# Patient Record
Sex: Male | Born: 1965 | Race: White | Hispanic: No | Marital: Married | Smoking: Never smoker
Health system: Southern US, Community
[De-identification: ages and names within clinical notes are randomized; demographics above are authoritative.]

## PROBLEM LIST (undated history)

## (undated) DIAGNOSIS — N200 Calculus of kidney: Secondary | ICD-10-CM

## (undated) DIAGNOSIS — N201 Calculus of ureter: Secondary | ICD-10-CM

## (undated) DIAGNOSIS — Z87442 Personal history of urinary calculi: Secondary | ICD-10-CM

## (undated) DIAGNOSIS — R3915 Urgency of urination: Secondary | ICD-10-CM

---

## 2003-09-21 ENCOUNTER — Other Ambulatory Visit: Payer: Self-pay

## 2010-02-11 ENCOUNTER — Emergency Department: Payer: Self-pay | Admitting: Emergency Medicine

## 2012-06-03 ENCOUNTER — Emergency Department: Payer: Self-pay | Admitting: Internal Medicine

## 2012-06-03 LAB — URINALYSIS, COMPLETE
Bacteria: NONE SEEN
Bilirubin,UR: NEGATIVE
Glucose,UR: NEGATIVE mg/dL (ref 0–75)
Leukocyte Esterase: NEGATIVE
Nitrite: NEGATIVE
Ph: 5 (ref 4.5–8.0)
RBC,UR: 2 /HPF (ref 0–5)
Specific Gravity: 1.025 (ref 1.003–1.030)
WBC UR: 3 /HPF (ref 0–5)

## 2012-06-03 LAB — CBC
MCHC: 32.9 g/dL (ref 32.0–36.0)
MCV: 68 fL — ABNORMAL LOW (ref 80–100)
Platelet: 223 10*3/uL (ref 150–440)
RBC: 5.27 10*6/uL (ref 4.40–5.90)
RDW: 30.6 % — ABNORMAL HIGH (ref 11.5–14.5)
WBC: 5.9 10*3/uL (ref 3.8–10.6)

## 2012-06-03 LAB — COMPREHENSIVE METABOLIC PANEL
Albumin: 4.6 g/dL (ref 3.4–5.0)
Alkaline Phosphatase: 77 U/L (ref 50–136)
Bilirubin,Total: 0.5 mg/dL (ref 0.2–1.0)
Chloride: 105 mmol/L (ref 98–107)
EGFR (African American): >60
EGFR (Non-African Amer.): >60
Glucose: 114 mg/dL — ABNORMAL HIGH (ref 65–99)
Osmolality: 283 (ref 275–301)
Potassium: 4.1 mmol/L (ref 3.5–5.1)
Sodium: 140 mmol/L (ref 136–145)

## 2012-06-03 LAB — LIPASE, BLOOD: Lipase: 133 U/L (ref 73–393)

## 2015-06-08 ENCOUNTER — Encounter: Payer: Self-pay | Admitting: Emergency Medicine

## 2015-06-08 ENCOUNTER — Emergency Department: Payer: BLUE CROSS/BLUE SHIELD

## 2015-06-08 ENCOUNTER — Emergency Department
Admission: EM | Admit: 2015-06-08 | Discharge: 2015-06-08 | Disposition: A | Discharge status: Home / Self Care | Payer: BLUE CROSS/BLUE SHIELD | Attending: Emergency Medicine | Admitting: Emergency Medicine

## 2015-06-08 DIAGNOSIS — N201 Calculus of ureter: Secondary | ICD-10-CM | POA: Diagnosis not present

## 2015-06-08 DIAGNOSIS — R197 Diarrhea, unspecified: Secondary | ICD-10-CM | POA: Insufficient documentation

## 2015-06-08 DIAGNOSIS — R1031 Right lower quadrant pain: Secondary | ICD-10-CM | POA: Diagnosis present

## 2015-06-08 LAB — COMPREHENSIVE METABOLIC PANEL
ALBUMIN: 4.6 g/dL (ref 3.5–5.0)
ALT: 14 U/L — ABNORMAL LOW (ref 17–63)
ANION GAP: 6 (ref 5–15)
AST: 21 U/L (ref 15–41)
Alkaline Phosphatase: 63 U/L (ref 38–126)
BILIRUBIN TOTAL: 0.4 mg/dL (ref 0.3–1.2)
BUN: 21 mg/dL — ABNORMAL HIGH (ref 6–20)
CHLORIDE: 104 mmol/L (ref 101–111)
CO2: 31 mmol/L (ref 22–32)
Calcium: 9.8 mg/dL (ref 8.9–10.3)
Creatinine, Ser: 1.32 mg/dL — ABNORMAL HIGH (ref 0.61–1.24)
GFR calc Af Amer: >60 mL/min (ref >60)
GFR calc non Af Amer: >60 mL/min (ref >60)
GLUCOSE: 104 mg/dL — AB (ref 65–99)
POTASSIUM: 4.3 mmol/L (ref 3.5–5.1)
SODIUM: 141 mmol/L (ref 135–145)
TOTAL PROTEIN: 7.4 g/dL (ref 6.5–8.1)

## 2015-06-08 LAB — CBC
HEMATOCRIT: 36.4 % — AB (ref 40.0–52.0)
HEMOGLOBIN: 11.3 g/dL — AB (ref 13.0–18.0)
MCH: 21.5 pg — ABNORMAL LOW (ref 26.0–34.0)
MCHC: 31 g/dL — AB (ref 32.0–36.0)
MCV: 69.3 fL — AB (ref 80.0–100.0)
Platelets: 265 10*3/uL (ref 150–440)
RBC: 5.26 MIL/uL (ref 4.40–5.90)
RDW: 29.3 % — ABNORMAL HIGH (ref 11.5–14.5)
WBC: 8.1 10*3/uL (ref 3.8–10.6)

## 2015-06-08 LAB — LIPASE, BLOOD: Lipase: 25 U/L (ref 22–51)

## 2015-06-08 MED ORDER — OXYCODONE-ACETAMINOPHEN 5-325 MG PO TABS: 1.0000 | ORAL_TABLET | ORAL | Status: DC | PRN

## 2015-06-08 MED ORDER — BARIUM SULFATE 2.1 % PO SUSP
450.0000 mL | ORAL | Status: AC
  Administered 2015-06-08: 450 mL via ORAL
  Filled 2015-06-08: qty 450

## 2015-06-08 MED ORDER — DIPHENHYDRAMINE HCL 50 MG/ML IJ SOLN
50.0000 mg | Freq: Once | INTRAMUSCULAR | Status: AC
  Administered 2015-06-08: 50 mg via INTRAVENOUS

## 2015-06-08 MED ORDER — METHYLPREDNISOLONE SODIUM SUCC 125 MG IJ SOLR
125.0000 mg | Freq: Once | INTRAMUSCULAR | Status: AC
  Administered 2015-06-08: 125 mg via INTRAVENOUS

## 2015-06-08 MED ORDER — ONDANSETRON HCL 4 MG/2ML IJ SOLN
INTRAMUSCULAR | Status: AC
  Administered 2015-06-08: 4 mg via INTRAVENOUS
  Filled 2015-06-08: qty 2

## 2015-06-08 MED ORDER — IOHEXOL 300 MG/ML  SOLN
100.0000 mL | Freq: Once | INTRAMUSCULAR | Status: AC | PRN
  Administered 2015-06-08: 100 mL via INTRAVENOUS
  Filled 2015-06-08: qty 100

## 2015-06-08 MED ORDER — TAMSULOSIN HCL 0.4 MG PO CAPS
ORAL_CAPSULE | ORAL | Status: AC
  Filled 2015-06-08: qty 1

## 2015-06-08 MED ORDER — MORPHINE SULFATE (PF) 4 MG/ML IV SOLN
INTRAVENOUS | Status: AC
  Administered 2015-06-08: 4 mg via INTRAVENOUS
  Filled 2015-06-08: qty 1

## 2015-06-08 MED ORDER — ONDANSETRON HCL 4 MG/2ML IJ SOLN
4.0000 mg | Freq: Once | INTRAMUSCULAR | Status: AC
  Administered 2015-06-08: 4 mg via INTRAVENOUS

## 2015-06-08 MED ORDER — TAMSULOSIN HCL 0.4 MG PO CAPS
ORAL_CAPSULE | ORAL | Status: AC
  Administered 2015-06-08: 0.4 mg via ORAL
  Filled 2015-06-08: qty 1

## 2015-06-08 MED ORDER — METHYLPREDNISOLONE SODIUM SUCC 125 MG IJ SOLR
INTRAMUSCULAR | Status: AC
  Administered 2015-06-08: 125 mg via INTRAVENOUS
  Filled 2015-06-08: qty 2

## 2015-06-08 MED ORDER — MORPHINE SULFATE (PF) 4 MG/ML IV SOLN
4.0000 mg | Freq: Once | INTRAVENOUS | Status: AC
  Administered 2015-06-08: 4 mg via INTRAVENOUS

## 2015-06-08 MED ORDER — TAMSULOSIN HCL 0.4 MG PO CAPS: 0.4000 mg | ORAL_CAPSULE | Freq: Every day | ORAL | Status: DC

## 2015-06-08 MED ORDER — TAMSULOSIN HCL 0.4 MG PO CAPS
0.4000 mg | ORAL_CAPSULE | Freq: Every day | ORAL | Status: DC
  Administered 2015-06-08: 0.4 mg via ORAL

## 2015-06-08 MED ORDER — ONDANSETRON HCL 4 MG PO TABS: 4.0000 mg | ORAL_TABLET | Freq: Every day | ORAL | Status: DC | PRN

## 2015-06-08 MED ORDER — DIPHENHYDRAMINE HCL 50 MG/ML IJ SOLN
INTRAMUSCULAR | Status: AC
  Administered 2015-06-08: 50 mg via INTRAVENOUS
  Filled 2015-06-08: qty 1

## 2015-06-08 NOTE — ED Notes (Signed)
Pt with right side abd pain started last night with some nausea and diarrhea.

## 2015-06-08 NOTE — ED Provider Notes (Signed)
[facility] Emergency Department Provider Note  ____________________________________________  Time seen: 1905  I have reviewed the triage vital signs and the nursing notes.   HISTORY  Chief Complaint Abdominal Pain   History limited by: Not Limited   HPI Carl Diaz is a 49 y.o. male who presents to the emergency department today with concerns for right lower quadrant abdominal pain. He states that the pain started yesterday. It has been fairly constant. It does not radiate. He describes it as sharp. It is accompanied with some nausea. He states he did have some diarrhea with the pain. He thought he might have been seeing some blood in the diarrhea. He denies any fevers.    History reviewed. No pertinent past medical history.  There are no active problems to display for this patient.   History reviewed. No pertinent past surgical history.  No current outpatient prescriptions on file.  Allergies Ivp dye  No family history on file.  Social History Social History  Substance Use Topics  . Smoking status: Never Smoker   . Smokeless tobacco: None  . Alcohol Use: No    Review of Systems Constitutional: Negative for fever. Cardiovascular: Negative for chest pain. Respiratory: Negative for shortness of breath. Gastrointestinal: Right lower quadrant pain, nausea, diarrhea Genitourinary: Negative for dysuria. Musculoskeletal: Negative for back pain. Skin: Negative for rash. Neurological: Negative for headaches, focal weakness or numbness.  10-point ROS otherwise negative.  ____________________________________________   PHYSICAL EXAM:  VITAL SIGNS: ED Triage Vitals  Enc Vitals Group     BP 06/08/15 1746 152/93 mmHg     Pulse Rate 06/08/15 1746 65     Resp 06/08/15 1746 20     Temp 06/08/15 1746 98.6 F (37 C)     Temp Source 06/08/15 1746 Oral     SpO2 06/08/15 1746 100 %     Weight 06/08/15 1746 173 lb (78.472 kg)     Height  06/08/15 1746 5\' 8"  (1.727 m)     Head Cir --      Peak Flow --      Pain Score 06/08/15 1747 8   Constitutional: Alert and oriented. Well appearing and in no distress. Eyes: Conjunctivae are normal. PERRL. Normal extraocular movements. ENT   Head: Normocephalic and atraumatic.   Nose: No congestion/rhinnorhea.   Mouth/Throat: Mucous membranes are moist.   Neck: No stridor. Hematological/Lymphatic/Immunilogical: No cervical lymphadenopathy. Cardiovascular: Normal rate, regular rhythm.  No murmurs, rubs, or gallops. Respiratory: Normal respiratory effort without tachypnea nor retractions. Breath sounds are clear and equal bilaterally. No wheezes/rales/rhonchi. Gastrointestinal: Soft and tender to palpation in the right lower quadrant. No rebound. No guarding. Genitourinary: Deferred Musculoskeletal: Normal range of motion in all extremities. No joint effusions.  No lower extremity tenderness nor edema. Neurologic:  Normal speech and language. No gross focal neurologic deficits are appreciated. Speech is normal.  Skin:  Skin is warm, dry and intact. No rash noted. Psychiatric: Mood and affect are normal. Speech and behavior are normal. Patient exhibits appropriate insight and judgment.  ____________________________________________    LABS (pertinent positives/negatives)  Labs Reviewed  COMPREHENSIVE METABOLIC PANEL - Abnormal; Notable for the following:    Glucose, Bld 104 (*)    BUN 21 (*)    Creatinine, Ser 1.32 (*)    ALT 14 (*)    All other components within normal limits  CBC - Abnormal; Notable for the following:    Hemoglobin 11.3 (*)    HCT 36.4 (*)  MCV 69.3 (*)    MCH 21.5 (*)    MCHC 31.0 (*)    RDW 29.3 (*)    All other components within normal limits  LIPASE, BLOOD    ____________________________________________   EKG  None  ____________________________________________    RADIOLOGY  CT abdomen and pelvis  IMPRESSION: Severe  hydronephrosis on the right with a 9 mm stone at the junction of the proximal and middle thirds of the right ureter. Enhancement of the proximal urothelium suggests infection or inflammation. There is also a smaller stone in the distal most right ureter. ____________________________________________   PROCEDURES  Procedure(s) performed: None  Critical Care performed: No  ____________________________________________   INITIAL IMPRESSION / ASSESSMENT AND PLAN / ED COURSE  Pertinent labs & imaging results that were available during my care of the patient were reviewed by me and considered in my medical decision making (see chart for details).  Patient presents to the emergency department today with right lower quadrant pain. On exam patient does have some mild tenderness to the right lower quadrant. CT abdomen and pelvis was obtained to evaluate for appendicitis. CT however returned with a 9 mm ureteral stone with severe hydronephrosis. I did discuss with Dr. Annabell Howells with urology. Given the patient's pain as well as control and no leukocytosis, fever or other signs or symptoms of infection the plan will be for the patient to be discharged home to follow-up in the office tomorrow. I did discuss this with the patient.  ____________________________________________   FINAL CLINICAL IMPRESSION(S) / ED DIAGNOSES  Final diagnoses:  Ureterolithiasis     Phineas Semen, MD 06/08/15 2229

## 2015-06-08 NOTE — ED Notes (Signed)

## 2015-06-08 NOTE — Discharge Instructions (Signed)
Please seek medical attention for any high fevers, chest pain, shortness of breath, change in behavior, persistent vomiting, bloody stool or any other new or concerning symptoms. ° ° °Kidney Stones °Kidney stones (urolithiasis) are deposits that form inside your kidneys. The intense pain is caused by the stone moving through the urinary tract. When the stone moves, the ureter goes into spasm around the stone. The stone is usually passed in the urine.  °CAUSES  °· A disorder that makes certain neck glands produce too much parathyroid hormone (primary hyperparathyroidism). °· A buildup of uric acid crystals, similar to gout in your joints. °· Narrowing (stricture) of the ureter. °· A kidney obstruction present at birth (congenital obstruction). °· Previous surgery on the kidney or ureters. °· Numerous kidney infections. °SYMPTOMS  °· Feeling sick to your stomach (nauseous). °· Throwing up (vomiting). °· Blood in the urine (hematuria). °· Pain that usually spreads (radiates) to the groin. °· Frequency or urgency of urination. °DIAGNOSIS  °· Taking a history and physical exam. °· Blood or urine tests. °· CT scan. °· Occasionally, an examination of the inside of the urinary bladder (cystoscopy) is performed. °TREATMENT  °· Observation. °· Increasing your fluid intake. °· Extracorporeal shock wave lithotripsy--This is a noninvasive procedure that uses shock waves to break up kidney stones. °· Surgery may be needed if you have severe pain or persistent obstruction. There are various surgical procedures. Most of the procedures are performed with the use of small instruments. Only small incisions are needed to accommodate these instruments, so recovery time is minimized. °The size, location, and chemical composition are all important variables that will determine the proper choice of action for you. Talk to your health care provider to better understand your situation so that you will minimize the risk of injury to yourself  and your kidney.  °HOME CARE INSTRUCTIONS  °· Drink enough water and fluids to keep your urine clear or pale yellow. This will help you to pass the stone or stone fragments. °· Strain all urine through the provided strainer. Keep all particulate matter and stones for your health care provider to see. The stone causing the pain may be as small as a grain of salt. It is very important to use the strainer each and every time you pass your urine. The collection of your stone will allow your health care provider to analyze it and verify that a stone has actually passed. The stone analysis will often identify what you can do to reduce the incidence of recurrences. °· Only take over-the-counter or prescription medicines for pain, discomfort, or fever as directed by your health care provider. °· Make a follow-up appointment with your health care provider as directed. °· Get follow-up X-rays if required. The absence of pain does not always mean that the stone has passed. It may have only stopped moving. If the urine remains completely obstructed, it can cause loss of kidney function or even complete destruction of the kidney. It is your responsibility to make sure X-rays and follow-ups are completed. Ultrasounds of the kidney can show blockages and the status of the kidney. Ultrasounds are not associated with any radiation and can be performed easily in a matter of minutes. °SEEK MEDICAL CARE IF: °· You experience pain that is progressive and unresponsive to any pain medicine you have been prescribed. °SEEK IMMEDIATE MEDICAL CARE IF:  °· Pain cannot be controlled with the prescribed medicine. °· You have a fever or shaking chills. °· The severity or intensity   of pain increases over 18 hours and is not relieved by pain medicine. °· You develop a new onset of abdominal pain. °· You feel faint or pass out. °· You are unable to urinate. °MAKE SURE YOU:  °· Understand these instructions. °· Will watch your condition. °· Will get  help right away if you are not doing well or get worse. °Document Released: 09/26/2005 Document Revised: 05/29/2013 Document Reviewed: 02/27/2013 °[employer]® Patient Information ©2015 [employer]. This information is not intended to replace advice given to you by your health care provider. Make sure you discuss any questions you have with your health care provider. ° °

## 2015-06-09 ENCOUNTER — Encounter (HOSPITAL_COMMUNITY)
Admission: AD | Disposition: A | Discharge status: Home / Self Care | Payer: Self-pay | Source: Ambulatory Visit | Attending: Urology

## 2015-06-09 ENCOUNTER — Ambulatory Visit (HOSPITAL_COMMUNITY): Payer: BLUE CROSS/BLUE SHIELD | Admitting: Anesthesiology

## 2015-06-09 ENCOUNTER — Encounter (HOSPITAL_COMMUNITY): Payer: Self-pay

## 2015-06-09 ENCOUNTER — Ambulatory Visit (HOSPITAL_COMMUNITY)
Admission: AD | Admit: 2015-06-09 | Discharge: 2015-06-09 | Disposition: A | Discharge status: Home / Self Care | Payer: BLUE CROSS/BLUE SHIELD | Source: Ambulatory Visit | Attending: Urology | Admitting: Urology

## 2015-06-09 ENCOUNTER — Other Ambulatory Visit: Payer: Self-pay | Admitting: Urology

## 2015-06-09 DIAGNOSIS — N201 Calculus of ureter: Secondary | ICD-10-CM | POA: Diagnosis present

## 2015-06-09 DIAGNOSIS — N132 Hydronephrosis with renal and ureteral calculous obstruction: Secondary | ICD-10-CM | POA: Insufficient documentation

## 2015-06-09 DIAGNOSIS — Z91041 Radiographic dye allergy status: Secondary | ICD-10-CM | POA: Insufficient documentation

## 2015-06-09 HISTORY — PX: CYSTOSCOPY WITH RETROGRADE PYELOGRAM, URETEROSCOPY AND STENT PLACEMENT: SHX5789

## 2015-06-09 SURGERY — CYSTOURETEROSCOPY, WITH RETROGRADE PYELOGRAM AND STENT INSERTION
Anesthesia: General | Site: Ureter | Laterality: Right

## 2015-06-09 MED ORDER — LIDOCAINE HCL 2 % EX GEL
CUTANEOUS | Status: AC
  Filled 2015-06-09: qty 10

## 2015-06-09 MED ORDER — LACTATED RINGERS IV SOLN
INTRAVENOUS | Status: AC
  Administered 2015-06-09: 1000 mL via INTRAVENOUS

## 2015-06-09 MED ORDER — PHENAZOPYRIDINE HCL 200 MG PO TABS
ORAL_TABLET | ORAL | Status: AC
  Administered 2015-06-09: 200 mg
  Filled 2015-06-09: qty 1

## 2015-06-09 MED ORDER — LIDOCAINE HCL 2 % EX GEL
CUTANEOUS | Status: DC | PRN
  Administered 2015-06-09: 1 via URETHRAL

## 2015-06-09 MED ORDER — OXYCODONE HCL 5 MG/5ML PO SOLN
5.0000 mg | Freq: Once | ORAL | Status: DC | PRN
  Filled 2015-06-09: qty 5

## 2015-06-09 MED ORDER — MIDAZOLAM HCL 2 MG/2ML IJ SOLN
INTRAMUSCULAR | Status: AC
  Filled 2015-06-09: qty 4

## 2015-06-09 MED ORDER — BELLADONNA ALKALOIDS-OPIUM 16.2-60 MG RE SUPP
RECTAL | Status: AC
  Filled 2015-06-09: qty 1

## 2015-06-09 MED ORDER — ACETAMINOPHEN 10 MG/ML IV SOLN
INTRAVENOUS | Status: AC
  Filled 2015-06-09: qty 100

## 2015-06-09 MED ORDER — FENTANYL CITRATE (PF) 100 MCG/2ML IJ SOLN
INTRAMUSCULAR | Status: DC | PRN
  Administered 2015-06-09 (×2): 25 ug via INTRAVENOUS
  Administered 2015-06-09: 50 ug via INTRAVENOUS
  Administered 2015-06-09 (×2): 25 ug via INTRAVENOUS

## 2015-06-09 MED ORDER — CEFAZOLIN SODIUM-DEXTROSE 2-3 GM-% IV SOLR
INTRAVENOUS | Status: AC
  Filled 2015-06-09: qty 50

## 2015-06-09 MED ORDER — TRAMADOL-ACETAMINOPHEN 37.5-325 MG PO TABS: 1.0000 | ORAL_TABLET | Freq: Four times a day (QID) | ORAL | Status: AC | PRN

## 2015-06-09 MED ORDER — PROPOFOL 10 MG/ML IV BOLUS
INTRAVENOUS | Status: AC
  Filled 2015-06-09: qty 20

## 2015-06-09 MED ORDER — FENTANYL CITRATE (PF) 100 MCG/2ML IJ SOLN
INTRAMUSCULAR | Status: AC
  Filled 2015-06-09: qty 4

## 2015-06-09 MED ORDER — PHENAZOPYRIDINE HCL 200 MG PO TABS: 200.0000 mg | ORAL_TABLET | Freq: Three times a day (TID) | ORAL | Status: AC | PRN

## 2015-06-09 MED ORDER — HYDROMORPHONE HCL 1 MG/ML IJ SOLN: 0.2500 mg | INTRAMUSCULAR | Status: DC | PRN

## 2015-06-09 MED ORDER — LIDOCAINE HCL (CARDIAC) 20 MG/ML IV SOLN
INTRAVENOUS | Status: AC
  Filled 2015-06-09: qty 5

## 2015-06-09 MED ORDER — OXYCODONE-ACETAMINOPHEN 5-325 MG PO TABS
ORAL_TABLET | ORAL | Status: AC
  Administered 2015-06-09: 1
  Filled 2015-06-09: qty 1

## 2015-06-09 MED ORDER — ACETAMINOPHEN 10 MG/ML IV SOLN
1000.0000 mg | Freq: Once | INTRAVENOUS | Status: AC
  Administered 2015-06-09: 1000 mg via INTRAVENOUS
  Filled 2015-06-09: qty 100

## 2015-06-09 MED ORDER — IOHEXOL 300 MG/ML  SOLN
INTRAMUSCULAR | Status: DC | PRN
  Administered 2015-06-09: 5 mL via URETHRAL

## 2015-06-09 MED ORDER — MIDAZOLAM HCL 5 MG/5ML IJ SOLN
INTRAMUSCULAR | Status: DC | PRN
  Administered 2015-06-09: 2 mg via INTRAVENOUS

## 2015-06-09 MED ORDER — BELLADONNA ALKALOIDS-OPIUM 16.2-60 MG RE SUPP
RECTAL | Status: DC | PRN
  Administered 2015-06-09: 1 via RECTAL

## 2015-06-09 MED ORDER — CEFAZOLIN SODIUM-DEXTROSE 2-3 GM-% IV SOLR
2.0000 g | INTRAVENOUS | Status: AC
  Administered 2015-06-09: 2 g via INTRAVENOUS

## 2015-06-09 MED ORDER — OXYCODONE-ACETAMINOPHEN 5-325 MG PO TABS: 1.0000 | ORAL_TABLET | Freq: Once | ORAL | Status: DC

## 2015-06-09 MED ORDER — PROMETHAZINE HCL 25 MG/ML IJ SOLN: 6.2500 mg | INTRAMUSCULAR | Status: DC | PRN

## 2015-06-09 MED ORDER — OXYCODONE HCL 5 MG PO TABS: 5.0000 mg | ORAL_TABLET | Freq: Once | ORAL | Status: DC | PRN

## 2015-06-09 MED ORDER — LIDOCAINE HCL (CARDIAC) 20 MG/ML IV SOLN
INTRAVENOUS | Status: DC | PRN
  Administered 2015-06-09: 50 mg via INTRAVENOUS

## 2015-06-09 MED ORDER — ONDANSETRON HCL 4 MG/2ML IJ SOLN
INTRAMUSCULAR | Status: AC
  Filled 2015-06-09: qty 2

## 2015-06-09 MED ORDER — PROPOFOL 10 MG/ML IV BOLUS
INTRAVENOUS | Status: DC | PRN
  Administered 2015-06-09: 150 mg via INTRAVENOUS
  Administered 2015-06-09 (×2): 50 mg via INTRAVENOUS

## 2015-06-09 MED ORDER — KETOROLAC TROMETHAMINE 30 MG/ML IJ SOLN
30.0000 mg | Freq: Once | INTRAMUSCULAR | Status: AC
  Administered 2015-06-09: 30 mg via INTRAVENOUS

## 2015-06-09 MED ORDER — SODIUM CHLORIDE 0.9 % IR SOLN
Status: DC | PRN
  Administered 2015-06-09: 1000 mL
  Administered 2015-06-09: 3000 mL

## 2015-06-09 SURGICAL SUPPLY — 19 items
BAG URO CATCHER STRL LF (DRAPE) ×3 IMPLANT
BASKET ZERO TIP NITINOL 2.4FR (BASKET) ×6 IMPLANT
CATH INTERMIT  6FR 70CM (CATHETERS) ×3 IMPLANT
CLOTH BEACON ORANGE TIMEOUT ST (SAFETY) ×3 IMPLANT
FIBER LASER FLEXIVA 1000 (UROLOGICAL SUPPLIES) IMPLANT
FIBER LASER FLEXIVA 200 (UROLOGICAL SUPPLIES) IMPLANT
FIBER LASER FLEXIVA 365 (UROLOGICAL SUPPLIES) ×3 IMPLANT
FIBER LASER FLEXIVA 550 (UROLOGICAL SUPPLIES) IMPLANT
FIBER LASER TRAC TIP (UROLOGICAL SUPPLIES) IMPLANT
GLOVE BIOGEL M STRL SZ7.5 (GLOVE) ×3 IMPLANT
GOWN STRL REUS W/TWL XL LVL3 (GOWN DISPOSABLE) ×3 IMPLANT
GUIDEWIRE STR DUAL SENSOR (WIRE) ×3 IMPLANT
MANIFOLD NEPTUNE II (INSTRUMENTS) ×3 IMPLANT
NS IRRIG 1000ML POUR BTL (IV SOLUTION) ×3 IMPLANT
PACK CYSTO (CUSTOM PROCEDURE TRAY) ×3 IMPLANT
SCRUB PCMX 4 OZ (MISCELLANEOUS) ×3 IMPLANT
STENT URET 6FRX24 CONTOUR (STENTS) ×3 IMPLANT
TUBING CONNECTING 10 (TUBING) ×2 IMPLANT
TUBING CONNECTING 10' (TUBING) ×1

## 2015-06-09 NOTE — Discharge Instructions (Addendum)
Dietary Guidelines to Help Prevent Kidney Stones Your risk of kidney stones can be decreased by adjusting the foods you eat. The most important thing you can do is drink enough fluid. You should drink enough fluid to keep your urine clear or pale yellow. The following guidelines provide specific information for the type of kidney stone you have had. GUIDELINES ACCORDING TO TYPE OF KIDNEY STONE Calcium Oxalate Kidney Stones  Reduce the amount of salt you eat. Foods that have a lot of salt cause your body to release excess calcium into your urine. The excess calcium can combine with a substance called oxalate to form kidney stones.  Reduce the amount of animal protein you eat if the amount you eat is excessive. Animal protein causes your body to release excess calcium into your urine. Ask your dietitian how much protein from animal sources you should be eating.  Avoid foods that are high in oxalates. If you take vitamins, they should have less than 500 mg of vitamin C. Your body turns vitamin C into oxalates. You do not need to avoid fruits and vegetables high in vitamin C. Calcium Phosphate Kidney Stones  Reduce the amount of salt you eat to help prevent the release of excess calcium into your urine.  Reduce the amount of animal protein you eat if the amount you eat is excessive. Animal protein causes your body to release excess calcium into your urine. Ask your dietitian how much protein from animal sources you should be eating.  Get enough calcium from food or take a calcium supplement (ask your dietitian for recommendations). Food sources of calcium that do not increase your risk of kidney stones include:  Broccoli.  Dairy products, such as cheese and yogurt.  Pudding. Uric Acid Kidney Stones  Do not have more than 6 oz of animal protein per day. FOOD SOURCES Animal Protein Sources  Meat (all types).  Poultry.  Eggs.  Fish, seafood. Foods High in [employer] seasonings.  Soy  sauce.  Teriyaki sauce.  Cured and processed meats.  Salted crackers and snack foods.  Fast food.  Canned soups and most canned foods. Foods High in Oxalates  Grains:  Amaranth.  Barley.  Grits.  Wheat germ.  Bran.  Buckwheat flour.  All bran cereals.  Pretzels.  Whole wheat bread.  Vegetables:  Beans (wax).  Beets and beet greens.  Collard greens.  Eggplant.  Escarole.  Leeks.  Okra.  Parsley.  Rutabagas.  Spinach.  Swiss chard.  Tomato paste.  Fried potatoes.  Sweet potatoes.  Fruits:  Red currants.  Figs.  Kiwi.  Rhubarb.  Meat and Other Protein Sources:  Beans (dried).  Soy burgers and other soybean products.  Miso.  Nuts (peanuts, almonds, pecans, cashews, hazelnuts).  Nut butters.  Sesame seeds and tahini (paste made of sesame seeds).  Poppy seeds.  Beverages:  Chocolate drink mixes.  Soy milk.  Instant iced tea.  Juices made from high-oxalate fruits or vegetables.  Other:  Carob.  Chocolate.  Fruitcake.  Marmalades. Document Released: 01/21/2011 Document Revised: 10/01/2013 Document Reviewed: 08/23/2013 [facility] Patient Information 2015 [place], [place]. This information is not intended to replace advice given to you by your health care provider. Make sure you discuss any questions you have with your health care provider.  Post Anesthesia Home Care Instructions  Activity: Get plenty of rest for the remainder of the day. A responsible adult should stay with you for 24 hours following the procedure.  For the next 24 hours,  DO NOT: -Drive a car -Advertising copywriter -Drink alcoholic beverages -Take any medication unless instructed by your physician -Make any legal decisions or sign important papers.  Meals: Start with liquid foods such as gelatin or soup. Progress to regular foods as tolerated. Avoid greasy, spicy, heavy foods. If nausea and/or vomiting occur, drink only clear liquids until the  nausea and/or vomiting subsides. Call your physician if vomiting continues.  Special Instructions/Symptoms: Your throat may feel dry or sore from the anesthesia or the breathing tube placed in your throat during surgery. If this causes discomfort, gargle with warm salt water. The discomfort should disappear within 24 hours.  If you had a scopolamine patch placed behind your ear for the management of post- operative nausea and/or vomiting:  1. The medication in the patch is effective for 72 hours, after which it should be removed.  Wrap patch in a tissue and discard in the trash. Wash hands thoroughly with soap and water. 2. You may remove the patch earlier than 72 hours if you experience unpleasant side effects which may include dry mouth, dizziness or visual disturbances. 3. Avoid touching the patch. Wash your hands with soap and water after contact with the patch.

## 2015-06-09 NOTE — Interval H&P Note (Signed)
History and Physical Interval Note:  06/09/2015 4:41 PM  Carl Diaz  has presented today for surgery, with the diagnosis of right ureteral stone  The various methods of treatment have been discussed with the patient and family. After consideration of risks, benefits and other options for treatment, the patient has consented to  Procedure(s): CYSTOSCOPY WITH RETROGRADE PYELOGRAM, URETEROSCOPY AND STENT PLACEMENT POSSIBLE BASKET STONE EXTRACTION (Right) as a surgical intervention .  The patient's history has been reviewed, patient examined, no change in status, stable for surgery.  I have reviewed the patient's chart and labs.  Questions were answered to the patient's satisfaction.   Pt will need Right Ureteroscopy and basket extraction of stone vs JJ stent on right side. He is marked, and ready for surgery.   Monice Lundy I Tedi Hughson

## 2015-06-09 NOTE — Anesthesia Procedure Notes (Signed)
Procedure Name: LMA Insertion Date/Time: 06/09/2015 6:05 PM Performed by: Paris Lore Pre-anesthesia Checklist: Patient identified, Emergency Drugs available, Suction available, Patient being monitored and Timeout performed Patient Re-evaluated:Patient Re-evaluated prior to inductionOxygen Delivery Method: Circle system utilized Preoxygenation: Pre-oxygenation with 100% oxygen Intubation Type: IV induction Ventilation: Mask ventilation without difficulty LMA: LMA inserted LMA Size: 4.0 Number of attempts: 1 Placement Confirmation: positive ETCO2 and breath sounds checked- equal and bilateral Tube secured with: Tape

## 2015-06-09 NOTE — Anesthesia Preprocedure Evaluation (Addendum)
Anesthesia Evaluation  Patient identified by MRN, date of birth, ID band Patient awake    Reviewed: Allergy & Precautions, NPO status , Patient's Chart, lab work & pertinent test results  Airway Mallampati: I  TM Distance: >3 FB Neck ROM: Full    Dental  (+) Teeth Intact, Dental Advisory Given   Pulmonary neg pulmonary ROS,  breath sounds clear to auscultation        Cardiovascular negative cardio ROS  Rhythm:Regular Rate:Normal     Neuro/Psych negative neurological ROS     GI/Hepatic negative GI ROS, Neg liver ROS,   Endo/Other  negative endocrine ROS  Renal/GU Renal disease     Musculoskeletal   Abdominal   Peds  Hematology negative hematology ROS (+)   Anesthesia Other Findings   Reproductive/Obstetrics                            Anesthesia Physical Anesthesia Plan  ASA: II  Anesthesia Plan: General   Post-op Pain Management:    Induction: Intravenous  Airway Management Planned: LMA  Additional Equipment:   Intra-op Plan:   Post-operative Plan:   Informed Consent: I have reviewed the patients History and Physical, chart, labs and discussed the procedure including the risks, benefits and alternatives for the proposed anesthesia with the patient or authorized representative who has indicated his/her understanding and acceptance.   Dental advisory given  Plan Discussed with: CRNA  Anesthesia Plan Comments:         Anesthesia Quick Evaluation

## 2015-06-09 NOTE — Op Note (Signed)
Pre-operative diagnosis : Right 4 mm distal ureteral calculus and 9 mm upper ureteral calculus  Postoperative diagnosis:  Same  Operation:  Cystourethroscopy, right retrograde Polygram interpretation, right distal ureteroscopy, and laser fragmentation and basket extraction of distal right ureteral calculus, right double-J stent (6 [place] by 24 cm, no suture)  Surgeon:  S. Patsi Sears, MD  First assistant: None    Anesthesia:  General LMA   Preparation:  After appropriate pre-anesthesia, the patient was brought the operating room, placed on the operating table in the dorsal supine position where general LMA anesthesia was introduced. He was replaced in the dorsal lithotomy position with the pubis was prepped with Betadine solution and draped in usual fashion. The arm band was double checked. The right arm was marked preoperatively, and the right flank was marked preoperatively.   Review history:  History of Present Illness 49 yo male referred by [facility] ER for f/u of kidney stone. He was seen at [facility] ER on 06/08/15 for RLQ abdominal pain associated with some nausea. CT showed a 9 mm proximal mid Rt ureteral stone with severe hydronephrosis and another 3-4 mm distal Rt ureteral stone. He also has a 3-4 mm Lt mid pole renal stone and a 1 mm Lt lower pole stone. He has urgency, frequency, dysuria, nocturia, hesitancy & intermittent flow, stranguria. Meds from ER: oxycodone 5/325, flomax, zofran.   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 No other family member with stones. No sodas. Water, coffee. Cranberry ( 2 full glasses at night). Minimal fast foods.   Past Medical History  Statement of  Likelihood of Success: Excellent. TIME-OUT observed.:  Procedure:  Cystourethroscopy was accomplished, and the urethra was noted to be normal. The bladder neck was normal. The bladder base was normal. There was no evidence  of trabeculation or cellule formation. There was no evidence of bladder stone or tumor formation.  Right retropyelogram revealed obstructing stone in distal ureter, and there appeared to be obstruction in the right mid ureter, consistent with stone seen on CT scan.  Guidewire was passed under fluoroscopic control and coiled in the right renal pelvis. The 6.5 single bridge ureteroscope was then passed into the right ureter, and the distal stone identified. However, because there was no double bridge scope available, the single bridge scope was used. Basket was placed through the single bridge, and the stone engaged. The stone could not be disengaged, and would not passed through the ureteral orifice. Therefore, the basket was clamped and cut and left to dangle. The ureteroscope was repassed, through the urethra, and then through the right ureteral orifice. The stone was again identified, trapped in the basket. The 6.5 French laser fiber was then passed through the ureteroscope, and with settings of 0.2 and 10, the stone was fragmented, and pieces removed. Stone was sent for analysis through the office. Past the patient received IV Tylenol and IV Toradol. As he also received IV antibody.  The stone was removed as well as a stone basket. Repeat ureteroscopy revealed no evidence of any stone basket within the ureter. In addition, all stone was removed. I therefore placed a double-J stent, measuring 6 [place] by 24 7 m, in the renal pelvis and in the bladder, under fluoroscopic and cystoscopic control. The patient was awakened after the bladder was drained of fluid, and taken to recovery room in good condition. He received a B and O suppository at the beginning  of the procedure, and urethral Xylocaine jelly at the end of procedure.

## 2015-06-09 NOTE — H&P (Signed)
Reason For Visit Kidney stone   Active Problems Problems  1. Bilateral kidney stones (N20.0) 2. Hydronephrosis, right (N13.30)  History of Present Illness 49 yo male referred by [facility] ER for f/u of kidney stone. He was seen at [facility] ER on 06/08/15 for RLQ abdominal pain associated with some nausea. CT showed a 9 mm proximal mid Rt ureteral stone with severe hydronephrosis and another 3-4 mm distal Rt ureteral stone. He also has a 3-4 mm Lt mid pole renal stone and a 1 mm Lt lower pole stone. He has urgency, frequency, dysuria, nocturia, hesitancy & intermittent flow, stranguria. Meds from ER: oxycodone 5/325, flomax, zofran.    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 No other family member with stones. No sodas. Water, coffee. Cranberry ( 2 full glasses at night). Minimal fast foods.   Past Medical History Problems  1. History of No significant past medical history  Surgical History Problems  1. History of No Surgical Problems  Allergies Medication  1. No Known Drug Allergies Non-Medication  2. Contrast Dye  Family History Problems  1. 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  Genitourinary: urinary frequency, feelings of urinary urgency, dysuria, nocturia, difficulty starting the urinary stream, urinary stream starts and stops and hematuria.  Gastrointestinal: nausea, abdominal pain and diarrhea.  Musculoskeletal: back pain.    Vitals Vital Signs [Data Includes: Last 1 Day]   Recorded: 30Aug2016 10:07AM  Height: 5 ft 8 in Weight: 173 lb  BMI Calculated: 26.3 BSA Calculated: 1.92 Blood Pressure: 135 / 87 Temperature: 97.9 F Heart Rate: 102  Physical Exam Constitutional: Well nourished and well developed . No acute distress.  ENT:. The ears and nose are normal in appearance.  Neck: The appearance of the neck is normal and no neck mass is present.  Pulmonary: No respiratory distress and normal respiratory rhythm and effort.  Cardiovascular: Heart rate and rhythm are normal . No peripheral edema.  Abdomen: The abdomen is flat and not distended. The abdomen is soft and nontender. No masses are palpated. Moderate tenderness in the RUQ is present and tenderness in the RLQ is present. Tenderness is present diffusely throughout the abdomen. mild right CVA tenderness and no left CVA tenderness. Bowel sounds are diminished. No hernias are palpable. No hepatosplenomegaly noted.  Genitourinary: Examination of the penis demonstrates no discharge, no masses, no lesions and a normal meatus. The scrotum is without lesions. The right epididymis is palpably normal and non-tender. The left epididymis is palpably normal and non-tender. The right testis is non-tender and without masses. The left testis is non-tender and without masses.  Lymphatics: The femoral and inguinal nodes are not enlarged or tender.  Skin: Normal skin turgor, no visible rash and no visible skin lesions.  Neuro/Psych:. Mood and affect are appropriate.    Results/Data Urine [Data Includes: Last 1 Day]   30Aug2016  COLOR AMBER   APPEARANCE CLEAR   SPECIFIC GRAVITY 1.020   pH 6.0   GLUCOSE NEGATIVE   BILIRUBIN NEGATIVE   KETONE TRACE   BLOOD TRACE   PROTEIN NEGATIVE  NITRITE NEGATIVE   LEUKOCYTE ESTERASE NEGATIVE   SQUAMOUS EPITHELIAL/HPF NONE SEEN HPF  WBC 0-5 WBC/HPF  RBC 3-10 RBC/HPF  BACTERIA NONE SEEN HPF  CRYSTALS NONE SEEN HPF  CASTS See Below LPF  Other    Yeast NONE SEEN HPF    Procedure KUB: Right upper ureteral stone , 9mm seen on KUB. contrast fom IV contrast from CT from [facility] last night still in kidney and contrast in renal pelvis. Lower ureteral stone not seen.     Assessment Assessed  1. Bilateral kidney stones (N20.0) 2. Hydronephrosis, right (N13.30)  Pt improved with Toradol. 9mm stone seen in right upper ureter. 10.2cm from stone to right skin, and 315 HU density. He will need cysto, right retrograde pyelogram, and possible extract 4mm Right lower ureteral stone, and right JJ stent to relieve obstruction from right 9mm stone. He will eventually need lithotripsy.   Plan Bilateral kidney stones  1. Litholink Stone Risk-Urine; Status:Hold For - [employer];  Requested for:30Aug2016;  Bilateral kidney stones, Hydronephrosis, right  2. Administered: Ketorolac Tromethamine 60 MG/2ML Injection Solution 3. KUB; Status:Resulted - Requires Verification;   Done: 30Aug2016 11:46AM Health Maintenance  4. UA With REFLEX; [Do Not Release]; Status:Resulted - Requires Verification;   Done:  30Aug2016 09:50AM  1. Litholink   2. KUB today  3. toradol today   4. Surgery tonight.   Signatures Electronically signed by : Jethro Bolus, M.D.; Jun 09 2015 12:31PM EST

## 2015-06-09 NOTE — Transfer of Care (Signed)
Immediate Anesthesia Transfer of Care Note  Patient: Carl Diaz  Procedure(s) Performed: Procedure(s): CYSTOSCOPY WITH RETROGRADE PYELOGRAM, URETEROSCOPY AND STENT PLACEMENT, HOLMIUM LASER APPLICATION  (Right)  Patient Location: PACU  Anesthesia Type:General  Level of Consciousness:  sedated, patient cooperative and responds to stimulation  Airway & Oxygen Therapy:Patient Spontanous Breathing and Patient connected to face mask oxgen  Post-op Assessment:  Report given to PACU RN and Post -op Vital signs reviewed and stable  Post vital signs:  Reviewed and stable  Last Vitals:  Filed Vitals:   06/09/15 1518  BP: 140/93  Pulse: 93  Temp: 36.7 C  Resp: 18    Complications: No apparent anesthesia complications

## 2015-06-10 ENCOUNTER — Encounter (HOSPITAL_COMMUNITY): Payer: Self-pay | Admitting: Urology

## 2015-06-12 NOTE — Anesthesia Postprocedure Evaluation (Signed)
  Anesthesia Post-op Note  Patient: Carl Diaz  Procedure(s) Performed: Procedure(s): CYSTOSCOPY WITH RETROGRADE PYELOGRAM, URETEROSCOPY AND STENT PLACEMENT, HOLMIUM LASER APPLICATION  (Right)  Patient Location: PACU  Anesthesia Type:General  Level of Consciousness: awake and alert   Airway and Oxygen Therapy: Patient Spontanous Breathing  Post-op Pain: none  Post-op Assessment: Post-op Vital signs reviewed              Post-op Vital Signs: Reviewed  Last Vitals:  Filed Vitals:   06/09/15 1900  BP:   Pulse:   Temp: 36.9 C  Resp:     Complications: No apparent anesthesia complications

## 2015-07-02 ENCOUNTER — Other Ambulatory Visit: Payer: Self-pay | Admitting: Urology

## 2015-07-08 ENCOUNTER — Encounter (HOSPITAL_COMMUNITY): Payer: Self-pay | Admitting: General Practice

## 2015-07-12 NOTE — H&P (Signed)
Reason For Visit 3 week f/u & CT   Active Problems Problems  1. Bilateral kidney stones (N20.0)   Assessed By: Jethro Bolus (Urology); Last Assessed: 09 Jun 2015  History of Present Illness     49 yo male returns today for a 3 week f/u & CT scan for evaluation of Rt upper ureteral stone. He is s/p cysto/Rt RPG/Rt ureteroscopy/laser of Rt distal ureteral stone/basket extraction/Rt JJ stent on 06/09/15.     Originally referred by [facility] ER for f/u of kidney stone. He was seen at [facility] ER on 06/08/15 for RLQ abdominal pain associated with some nausea. CT showed a 9 mm proximal mid Rt ureteral stone with severe hydronephrosis and another 3-4 mm distal Rt ureteral stone. He also has a 3-4 mm Lt mid pole renal stone and a 1 mm Lt lower pole stone. He has urgency, frequency, dysuria, nocturia, hesitancy & intermittent flow, stranguria. Meds from ER: oxycodone 5/325, flomax, zofran.     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 No other family member with stones. No sodas. Water, coffee. Cranberry ( 2 full glasses at night). Minimal fast foods.   Past Medical History Problems  1. History of No significant past medical history  Surgical History Problems  1. History of Cystoscopy With Insertion Of Ureteral Stent Right 2. History of Cystoscopy With Ureteroscopy With Lithotripsy 3. History of No Surgical Problems  Current Meds 1. Ondansetron HCl - 4 MG Oral Tablet;  Therapy: (Recorded:19Sep2016) to Recorded 2. OxyCODONE HCl TABS;  Therapy: (Recorded:19Sep2016) to Recorded 3. Phenazopyridine HCl TABS;  Therapy: (Recorded:19Sep2016) to Recorded 4. Tamsulosin HCl - 0.4 MG Oral Capsule;  Therapy: (Recorded:19Sep2016) to Recorded 5. Tramadol-Acetaminophen 37.5-325 MG Oral Tablet;  Therapy: (Recorded:19Sep2016) to Recorded  Allergies Medication  1. No Known Drug Allergies Non-Medication  2.  Contrast Dye  Family History Problems  1. No pertinent family history : Father  Social History Problems  1. Caffeine use (F15.90) 2. Married 3. Never a smoker 4. No alcohol use 5. Two children  Review of Systems Genitourinary, constitutional, skin, eye, otolaryngeal, hematologic/lymphatic, cardiovascular, pulmonary, endocrine, musculoskeletal, gastrointestinal, neurological and psychiatric system(s) were reviewed and pertinent findings if present are noted and are otherwise negative.    Vitals Vital Signs [Data Includes: Last 1 Day]  Recorded: 19Sep2016 09:30AM  Blood Pressure: 138 / 96 Temperature: 98.4 F Heart Rate: 67  Physical Exam Constitutional: Well nourished and well developed . No acute distress.  ENT:. The ears and nose are normal in appearance.  Neck: The appearance of the neck is normal and no neck mass is present.  Pulmonary: No respiratory distress and normal respiratory rhythm and effort.  Cardiovascular: Heart rate and rhythm are normal . No peripheral edema.  Abdomen: The abdomen is soft and nontender. No masses are palpated. No CVA tenderness. No hernias are palpable. No hepatosplenomegaly noted.  Rectal: Rectal exam demonstrates normal sphincter tone, no tenderness and no masses. The prostate has no nodularity and is not tender. The left seminal vesicle is nonpalpable. The right seminal vesicle is nonpalpable. The perineum is normal on inspection.  Genitourinary: Examination of the penis demonstrates no discharge, no masses, no lesions and a normal meatus. The scrotum is without lesions. The right epididymis is palpably normal and non-tender. The left epididymis is palpably normal and non-tender. The right testis is non-tender and without masses. The left testis is non-tender and without masses.  Lymphatics: The femoral  and inguinal nodes are not enlarged or tender.  Skin: Normal skin turgor, no visible rash and no visible skin lesions.  Neuro/Psych:. Mood and  affect are appropriate.    Results/Data Selected Results  AU CT-STONE PROTOCOL 19Sep2016 12:00AM Jethro Bolus   Test Name Result Flag Reference  CT-STONE PROTOCOL (Report)    ** RADIOLOGY REPORT BY [facility] **   CLINICAL DATA: 49 year old male with history of right ureteral stone with associated hydronephrosis noted on prior CT scan. Right-sided back pain and right lower quadrant abdominal pain for the past month. Micro hematuria. No associated weight loss or fever.  EXAM: CT ABDOMEN AND PELVIS WITHOUT CONTRAST  TECHNIQUE: Multidetector CT imaging of the abdomen and pelvis was performed following the standard protocol without IV contrast.  COMPARISON: CT of the abdomen and pelvis 06/08/2015.  FINDINGS: Lower chest: Unremarkable.  Hepatobiliary: No definite cystic or solid hepatic lesions are identified on today's noncontrast CT examination. The unenhanced appearance of the gallbladder is normal.  Pancreas: No pancreatic mass or peripancreatic inflammatory changes on today's noncontrast CT examination.  Spleen: Benign-appearing well-circumscribed 2.7 cm low-attenuation lesion in the anterior aspect of the spleen is unchanged, and although incompletely characterized, is strongly favored to be a benign lesion such as a cyst.  Adrenals/Urinary Tract: Multiple small nonobstructive calculi are present within the collecting systems of the kidneys bilaterally, measuring up to 5 mm in the interpolar collecting system of the left kidney. Compared to the prior study there has been interval placement of a right-sided double-J ureteral stent, with proximal loop properly reformed in the right renal pelvis and distal loop reformed in the lumen of the urinary bladder. Previously noted calculus at the junction of proximal and middle third of the right ureter is unchanged in position measuring 6 mm (image 36 of series 2). The other previously noted calculus in the  distal third of the right ureter has apparently passed. No additional left ureteral or bladder calculi are identified on today's examination. Mild residual hydroureteronephrosis is noted proximal to the stone in the right ureter. The unenhanced appearance of the adrenal glands and urinary bladder is unremarkable.  Stomach/Bowel: The unenhanced appearance of the stomach is normal. No pathologic dilatation of small bowel or colon. Normal appendix.  Vascular/Lymphatic: No significant atherosclerotic calcifications or definite aneurysm identified in the abdominal or pelvic vasculature on today's noncontrast CT examination. No definite lymphadenopathy noted in the abdomen or pelvis.  Reproductive: Prostate gland and seminal vesicles are unremarkable in appearance.  Other: No significant volume of ascites. No pneumoperitoneum.  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 There are no aggressive appearing lytic or blastic lesions noted in the visualized portions of the skeleton.  IMPRESSION: 1. Interval placement of a right-sided double-J ureteral stent which appears properly located. The previously noted distal ureteral stone has passed, while the more proximal 6 mm calculus is unchanged in position at the junction of proximal and middle thirds of the right ureter. There is some associated mild proximal hydroureteronephrosis. 2. Additional nonobstructive calculi in the collecting systems of the kidneys bilaterally, measuring up to 5 mm in the interpolar region of the left kidney. 3. Additional incidental findings, as above.   Electronically Signed  By: Trudie Reed M.D.  On: 06/29/2015 10:12   Assessment I have reviewed the CT scan with the patient. I have awaited radiographic  overview,  who agrees that there is a 6 number stone in the upper ureter with proximal hydronephrosis. I have given the patient the option of proceeding with lithotripsy versus ureteroscopy and laser fractionation of stone. The patient desires to have immediate lithotripsy as soon as possible. He understands and this will be performed by a partner, because I will not be back in the lithotripter until December. He is thin, and his stone is less than 10 cm from the skin wall. He should do well with lithotripsy. He will have his double-J removed after he has lithotripsy.   Plan Right ureteral stone  1. Renew: Sprix 15.75 MG/SPRAY Nasal Solution; INSTILL 1 SQUIRT Every 8 hours 2. Administered: Ketorolac Tromethamine 60 MG/2ML Injection Solution  Schedule lithotripsy for patient.   Signatures Electronically signed by : Jethro Bolus, M.D.; Jun 29 2015  5:04PM EST

## 2015-07-13 ENCOUNTER — Encounter (HOSPITAL_COMMUNITY)
Admission: RE | Disposition: A | Discharge status: Home / Self Care | Payer: Self-pay | Source: Ambulatory Visit | Attending: Urology

## 2015-07-13 ENCOUNTER — Encounter (HOSPITAL_COMMUNITY): Payer: Self-pay | Admitting: General Practice

## 2015-07-13 ENCOUNTER — Ambulatory Visit (HOSPITAL_COMMUNITY)
Admission: RE | Admit: 2015-07-13 | Discharge: 2015-07-13 | Disposition: A | Discharge status: Home / Self Care | Payer: BLUE CROSS/BLUE SHIELD | Source: Ambulatory Visit | Attending: Urology | Admitting: Urology

## 2015-07-13 ENCOUNTER — Ambulatory Visit (HOSPITAL_COMMUNITY): Payer: BLUE CROSS/BLUE SHIELD

## 2015-07-13 DIAGNOSIS — Z79899 Other long term (current) drug therapy: Secondary | ICD-10-CM | POA: Diagnosis not present

## 2015-07-13 DIAGNOSIS — N201 Calculus of ureter: Secondary | ICD-10-CM | POA: Diagnosis present

## 2015-07-13 DIAGNOSIS — Z79891 Long term (current) use of opiate analgesic: Secondary | ICD-10-CM | POA: Diagnosis not present

## 2015-07-13 DIAGNOSIS — N132 Hydronephrosis with renal and ureteral calculous obstruction: Secondary | ICD-10-CM | POA: Diagnosis not present

## 2015-07-13 HISTORY — PX: EXTRACORPOREAL SHOCK WAVE LITHOTRIPSY: SHX1557

## 2015-07-13 SURGERY — LITHOTRIPSY, ESWL
Anesthesia: LOCAL | Laterality: Right

## 2015-07-13 MED ORDER — ACETAMINOPHEN 325 MG PO TABS: 650.0000 mg | ORAL_TABLET | ORAL | Status: DC | PRN

## 2015-07-13 MED ORDER — FENTANYL CITRATE (PF) 100 MCG/2ML IJ SOLN: 25.0000 ug | INTRAMUSCULAR | Status: DC | PRN

## 2015-07-13 MED ORDER — OXYCODONE HCL 5 MG PO TABS: 5.0000 mg | ORAL_TABLET | ORAL | Status: DC | PRN

## 2015-07-13 MED ORDER — ACETAMINOPHEN 650 MG RE SUPP
650.0000 mg | RECTAL | Status: DC | PRN
  Filled 2015-07-13: qty 1

## 2015-07-13 MED ORDER — DIAZEPAM 5 MG PO TABS
10.0000 mg | ORAL_TABLET | ORAL | Status: AC
  Administered 2015-07-13: 10 mg via ORAL
  Filled 2015-07-13: qty 2

## 2015-07-13 MED ORDER — DIPHENHYDRAMINE HCL 25 MG PO CAPS
25.0000 mg | ORAL_CAPSULE | ORAL | Status: AC
  Administered 2015-07-13: 25 mg via ORAL
  Filled 2015-07-13: qty 1

## 2015-07-13 MED ORDER — SODIUM CHLORIDE 0.9 % IV SOLN: 250.0000 mL | INTRAVENOUS | Status: DC | PRN

## 2015-07-13 MED ORDER — CIPROFLOXACIN HCL 500 MG PO TABS
500.0000 mg | ORAL_TABLET | ORAL | Status: AC
  Administered 2015-07-13: 500 mg via ORAL
  Filled 2015-07-13: qty 1

## 2015-07-13 MED ORDER — SODIUM CHLORIDE 0.9 % IJ SOLN: 3.0000 mL | Freq: Two times a day (BID) | INTRAMUSCULAR | Status: DC

## 2015-07-13 MED ORDER — SODIUM CHLORIDE 0.9 % IJ SOLN: 3.0000 mL | INTRAMUSCULAR | Status: DC | PRN

## 2015-07-13 MED ORDER — SODIUM CHLORIDE 0.9 % IV SOLN
INTRAVENOUS | Status: DC
  Administered 2015-07-13: 07:00:00 via INTRAVENOUS

## 2015-07-13 NOTE — Discharge Instructions (Signed)

## 2015-07-13 NOTE — Interval H&P Note (Signed)
History and Physical Interval Note:  07/13/2015 11:01 AM  Carl Diaz  has presented today for surgery, with the diagnosis of RIGHT UPJ STONE  The various methods of treatment have been discussed with the patient and family. After consideration of risks, benefits and other options for treatment, the patient has consented to  Procedure(s): RIGHT EXTRACORPOREAL SHOCK WAVE LITHOTRIPSY (ESWL) (Right) as a surgical intervention .  The patient's history has been reviewed, patient examined, no change in status, stable for surgery.  I have reviewed the patient's chart and labs.  Questions were answered to the patient's satisfaction.     Tanazia Achee J

## 2015-07-24 ENCOUNTER — Encounter (HOSPITAL_BASED_OUTPATIENT_CLINIC_OR_DEPARTMENT_OTHER): Payer: Self-pay | Admitting: *Deleted

## 2015-07-24 ENCOUNTER — Other Ambulatory Visit: Payer: Self-pay | Admitting: Urology

## 2015-07-24 NOTE — Progress Notes (Signed)
SPOKE W/ WIFE.  NPO AFTER MN.  ARRIVE AT 0930. NEEDS HG. MAY TAKE TRAMADOL AM DOS W/ SIPS OF WATER.

## 2015-07-26 NOTE — H&P (Signed)
Reason For Visit Review 24hr urine/KUB and possible cysto/Rt JJ stent removal   Active Problems Problems  1. Bilateral kidney stones (N20.0)   Assessed By: Jethro Bolusannenbaum, Desera Graffeo (Urology); Last Assessed: 09 Jul 2015 2. Right ureteral stone (N20.1)  History of Present Illness     49 yo male returns today to review 24hr urine results, a KUB and possible cystoscopy/Rt JJ stent removal. He is s/p Rt ESWL on 07/13/15 by Dr. Annabell HowellsWrenn for a 6mm Rt UPJ stone. He is s/p cysto/Rt RPG/Rt ureteroscopy/laser of Rt distal ureteral stone/basket extraction/Rt JJ stent on 06/09/15.     Originally referred by [facility] ER for f/u of kidney stone. He was seen at [facility] ER on 06/08/15 for RLQ abdominal pain associated with some nausea. CT showed a 9 mm proximal mid Rt ureteral stone with severe hydronephrosis and another 3-4 mm distal Rt ureteral stone. He also has a 3-4 mm Lt mid pole renal stone and a 1 mm Lt lower pole stone. He has urgency, frequency, dysuria, nocturia, hesitancy & intermittent flow, stranguria. Meds from ER: oxycodone 5/325, flomax, zofran.     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 He has passed multiple stones, but has never had stone analysis. No other family member with stones. No sodas. Water, coffee. Cranberry (2 full glasses at night). Minimal fast foods.   Past Medical History Problems  1. History of No significant past medical history  Surgical History Problems  1. History of Cystoscopy With Insertion Of Ureteral Stent Right 2. History of Cystoscopy With Ureteroscopy With Lithotripsy 3. History of Lithotripsy 4. History of No Surgical Problems  Current Meds 1. Ondansetron HCl - 4 MG Oral Tablet;  Therapy: (Recorded:19Sep2016) to Recorded 2. OxyCODONE HCl TABS;  Therapy: (Recorded:19Sep2016) to Recorded 3. Phenazopyridine HCl TABS;  Therapy: (Recorded:19Sep2016) to Recorded 4. Sprix 15.75 MG/SPRAY Nasal Solution; INSTILL 1 SQUIRT Every 8 hours;   Therapy: 19Sep2016 to (Last Rx:19Sep2016)  Requested for: 19Sep2016 Ordered 5. Tamsulosin HCl - 0.4 MG Oral Capsule;  Therapy: (Recorded:19Sep2016) to Recorded 6. Tramadol-Acetaminophen 37.5-325 MG Oral Tablet;  Therapy: (Recorded:19Sep2016) to Recorded  Allergies Medication  1. No Known Drug Allergies Non-Medication  2. Contrast Dye  Family History Problems  1. No pertinent family history : Father  Social History Problems  1. Caffeine use (F15.90) 2. Married 3. Never a smoker 4. No alcohol use 5. Two children  Review of Systems Genitourinary, constitutional, skin, eye, otolaryngeal, hematologic/lymphatic, cardiovascular, pulmonary, endocrine, musculoskeletal, gastrointestinal, neurological and psychiatric system(s) were reviewed and pertinent findings if present are noted and are otherwise negative.  Genitourinary: hematuria.    Vitals Vital Signs [Data Includes: Last 1 Day]  Recorded: 13Oct2016 09:34AM  Blood Pressure: 164 / 84 Temperature: 98.1 F Heart Rate: 69  Physical Exam Constitutional: Well nourished and well developed . No acute distress.  ENT:. The ears and nose are normal in appearance.  Neck: The appearance of the neck is normal and no neck mass is present.  Pulmonary: No respiratory distress and normal respiratory rhythm and effort.  Cardiovascular: Heart rate and rhythm are normal . No peripheral edema.  Abdomen: The abdomen is soft and nontender. No masses are palpated. No CVA tenderness. No hernias are palpable. No hepatosplenomegaly noted.  Rectal: Rectal exam demonstrates normal sphincter tone, no tenderness and no masses. The prostate has no nodularity and is not tender. The left seminal vesicle is nonpalpable. The right seminal vesicle is nonpalpable. The perineum is normal on inspection.  Genitourinary: Examination of the penis  demonstrates no discharge, no masses, no lesions and a normal meatus. The scrotum is without lesions. The right epididymis is  palpably normal and non-tender. The left epididymis is palpably normal and non-tender. The right testis is non-tender and without masses. The left testis is non-tender and without masses.  Lymphatics: The femoral and inguinal nodes are not enlarged or tender.  Skin: Normal skin turgor, no visible rash and no visible skin lesions.  Neuro/Psych:. Mood and affect are appropriate.    Results/Data Urine [Data Includes: Last 1 Day]   13Oct2016  COLOR AMBER   APPEARANCE CLOUDY   SPECIFIC GRAVITY 1.020   pH 6.0   GLUCOSE NEGATIVE   BILIRUBIN NEGATIVE   KETONE NEGATIVE   BLOOD 3+   PROTEIN 1+   NITRITE NEGATIVE   LEUKOCYTE ESTERASE TRACE   SQUAMOUS EPITHELIAL/HPF 0-5 HPF  WBC 6-10 WBC/HPF  RBC >60 RBC/HPF  BACTERIA FEW HPF  CRYSTALS NONE SEEN HPF  CASTS NONE SEEN LPF  Yeast NONE SEEN HPF   KUB: The previously identified 8 mm stone is now seen in the same location in the ureter, but broken into 2 pieces. The remaining KUB is unchanged. The patient has normal kidneys with no further calculi. The ribs are normal. The bone structures are normal. Gas pattern is normal.   Assessment The KUB is reviewed with the patient. He has to 4 mm stones now in the right upper ureter. Double-J catheter is in position. We discussed the possibilities of repeat lithotripsy versus ureteroscopy and laser fragmentation and removal stone. He is in favor removal of stone as soon as possible, and we will plan for ureteroscopy razor fragmentation and repeat double-J stent, and leaving the suture on the double-J so he can remove it himself at home. I believe his ureter should be dilated enough to be able to get to the mid to upper ureteral portion to remove the stone.    In addition, we discussed his 24-hour urine, which shows that he has abnormal calcium values, and that he has a low citrate value. After his surgery, we will we will begin him on potassium citrate, and repeat is 24 urine (litho-link) in 6  months.    He is also encouraged to avoid dehydration, drink copious at the lemonade, and avoid all soft drinks. I discussed this with the son as well as he appears to have genetic predisposition to form stones.   Plan Bilateral kidney stones  1. HYPERCALCIURA PROFILE; Status:In Progress - Specimen/Data Collected;   Done:  13Oct2016 2. VENIPUNCTURE; Status:Complete;   Done: 13Oct2016  Remove stones via ureteroscopy, laser fragmentation of 24 mm stones, and double-J stent with suture attached.   Signatures Electronically signed by : Jethro Bolus, M.D.; Jul 23 2015  1:23PM EST

## 2015-07-27 ENCOUNTER — Encounter (HOSPITAL_BASED_OUTPATIENT_CLINIC_OR_DEPARTMENT_OTHER)
Admission: RE | Disposition: A | Discharge status: Home / Self Care | Payer: Self-pay | Source: Ambulatory Visit | Attending: Urology

## 2015-07-27 ENCOUNTER — Encounter (HOSPITAL_BASED_OUTPATIENT_CLINIC_OR_DEPARTMENT_OTHER): Payer: Self-pay

## 2015-07-27 ENCOUNTER — Ambulatory Visit (HOSPITAL_BASED_OUTPATIENT_CLINIC_OR_DEPARTMENT_OTHER)
Admission: RE | Admit: 2015-07-27 | Discharge: 2015-07-27 | Disposition: A | Discharge status: Home / Self Care | Payer: BLUE CROSS/BLUE SHIELD | Source: Ambulatory Visit | Attending: Urology | Admitting: Urology

## 2015-07-27 ENCOUNTER — Ambulatory Visit (HOSPITAL_BASED_OUTPATIENT_CLINIC_OR_DEPARTMENT_OTHER): Payer: BLUE CROSS/BLUE SHIELD | Admitting: Anesthesiology

## 2015-07-27 DIAGNOSIS — N201 Calculus of ureter: Secondary | ICD-10-CM

## 2015-07-27 DIAGNOSIS — Z91041 Radiographic dye allergy status: Secondary | ICD-10-CM | POA: Insufficient documentation

## 2015-07-27 DIAGNOSIS — Z466 Encounter for fitting and adjustment of urinary device: Secondary | ICD-10-CM | POA: Diagnosis not present

## 2015-07-27 DIAGNOSIS — N132 Hydronephrosis with renal and ureteral calculous obstruction: Secondary | ICD-10-CM | POA: Diagnosis present

## 2015-07-27 HISTORY — PX: CYSTOSCOPY W/ RETROGRADES: SHX1426

## 2015-07-27 HISTORY — PX: CYSTOSCOPY WITH STENT PLACEMENT: SHX5790

## 2015-07-27 HISTORY — DX: Calculus of kidney: N20.0

## 2015-07-27 HISTORY — DX: Personal history of urinary calculi: Z87.442

## 2015-07-27 HISTORY — PX: CYSTOSCOPY WITH URETEROSCOPY, STONE BASKETRY AND STENT PLACEMENT: SHX6378

## 2015-07-27 HISTORY — DX: Urgency of urination: R39.15

## 2015-07-27 HISTORY — PX: HOLMIUM LASER APPLICATION: SHX5852

## 2015-07-27 HISTORY — PX: CYSTOSCOPY W/ URETERAL STENT REMOVAL: SHX1430

## 2015-07-27 HISTORY — DX: Calculus of ureter: N20.1

## 2015-07-27 SURGERY — CYSTOSCOPY, WITH CALCULUS MANIPULATION OR REMOVAL
Anesthesia: General | Site: Ureter | Laterality: Right

## 2015-07-27 MED ORDER — MEPERIDINE HCL 25 MG/ML IJ SOLN
6.2500 mg | INTRAMUSCULAR | Status: DC | PRN
  Filled 2015-07-27: qty 1

## 2015-07-27 MED ORDER — LACTATED RINGERS IV SOLN
INTRAVENOUS | Status: DC
  Administered 2015-07-27: 10:00:00 via INTRAVENOUS
  Filled 2015-07-27: qty 1000

## 2015-07-27 MED ORDER — FENTANYL CITRATE (PF) 100 MCG/2ML IJ SOLN
INTRAMUSCULAR | Status: DC | PRN
  Administered 2015-07-27 (×4): 50 ug via INTRAVENOUS

## 2015-07-27 MED ORDER — FENTANYL CITRATE (PF) 100 MCG/2ML IJ SOLN
INTRAMUSCULAR | Status: AC
  Filled 2015-07-27: qty 4

## 2015-07-27 MED ORDER — KETOROLAC TROMETHAMINE 30 MG/ML IJ SOLN
INTRAMUSCULAR | Status: DC | PRN
  Administered 2015-07-27: 30 mg via INTRAVENOUS

## 2015-07-27 MED ORDER — DIATRIZOATE MEGLUMINE 30 % UR SOLN
URETHRAL | Status: DC | PRN
  Administered 2015-07-27: 10 mL via URETHRAL

## 2015-07-27 MED ORDER — SODIUM CHLORIDE 0.9 % IR SOLN
Status: DC | PRN
Start: 2015-07-27 — End: 2015-07-27
  Administered 2015-07-27: 4500 mL

## 2015-07-27 MED ORDER — BELLADONNA ALKALOIDS-OPIUM 16.2-60 MG RE SUPP
RECTAL | Status: DC | PRN
  Administered 2015-07-27: 1 via RECTAL

## 2015-07-27 MED ORDER — MIDAZOLAM HCL 2 MG/2ML IJ SOLN
INTRAMUSCULAR | Status: AC
  Filled 2015-07-27: qty 2

## 2015-07-27 MED ORDER — LIDOCAINE HCL (CARDIAC) 20 MG/ML IV SOLN
INTRAVENOUS | Status: DC | PRN
  Administered 2015-07-27: 100 mg via INTRAVENOUS

## 2015-07-27 MED ORDER — ACETAMINOPHEN 10 MG/ML IV SOLN
INTRAVENOUS | Status: DC | PRN
  Administered 2015-07-27: 1000 mg via INTRAVENOUS

## 2015-07-27 MED ORDER — DEXAMETHASONE SODIUM PHOSPHATE 4 MG/ML IJ SOLN
INTRAMUSCULAR | Status: DC | PRN
  Administered 2015-07-27: 10 mg via INTRAVENOUS

## 2015-07-27 MED ORDER — CEFAZOLIN SODIUM-DEXTROSE 2-3 GM-% IV SOLR
INTRAVENOUS | Status: AC
  Filled 2015-07-27: qty 50

## 2015-07-27 MED ORDER — TRAMADOL-ACETAMINOPHEN 37.5-325 MG PO TABS: 1.0000 | ORAL_TABLET | Freq: Four times a day (QID) | ORAL | Status: AC | PRN

## 2015-07-27 MED ORDER — FENTANYL CITRATE (PF) 100 MCG/2ML IJ SOLN
INTRAMUSCULAR | Status: AC
  Filled 2015-07-27: qty 2

## 2015-07-27 MED ORDER — PHENAZOPYRIDINE HCL 200 MG PO TABS: 200.0000 mg | ORAL_TABLET | Freq: Three times a day (TID) | ORAL | Status: AC | PRN

## 2015-07-27 MED ORDER — PROMETHAZINE HCL 25 MG/ML IJ SOLN
6.2500 mg | INTRAMUSCULAR | Status: DC | PRN
  Filled 2015-07-27: qty 1

## 2015-07-27 MED ORDER — FENTANYL CITRATE (PF) 100 MCG/2ML IJ SOLN
25.0000 ug | INTRAMUSCULAR | Status: DC | PRN
  Administered 2015-07-27: 50 ug via INTRAVENOUS
  Filled 2015-07-27: qty 1

## 2015-07-27 MED ORDER — CEFAZOLIN SODIUM-DEXTROSE 2-3 GM-% IV SOLR
2.0000 g | INTRAVENOUS | Status: AC
  Administered 2015-07-27: 2 g via INTRAVENOUS
  Filled 2015-07-27: qty 50

## 2015-07-27 MED ORDER — MIDAZOLAM HCL 5 MG/5ML IJ SOLN
INTRAMUSCULAR | Status: DC | PRN
Start: 2015-07-27 — End: 2015-07-27
  Administered 2015-07-27: 2 mg via INTRAVENOUS

## 2015-07-27 MED ORDER — TRIMETHOPRIM 100 MG PO TABS: 100.0000 mg | ORAL_TABLET | ORAL | Status: AC

## 2015-07-27 MED ORDER — LIDOCAINE HCL 2 % EX GEL
CUTANEOUS | Status: DC | PRN
  Administered 2015-07-27: 1 via URETHRAL

## 2015-07-27 MED ORDER — PROPOFOL 10 MG/ML IV BOLUS
INTRAVENOUS | Status: DC | PRN
  Administered 2015-07-27: 200 mg via INTRAVENOUS
  Administered 2015-07-27: 30 mg via INTRAVENOUS
  Administered 2015-07-27: 50 mg via INTRAVENOUS

## 2015-07-27 MED ORDER — TRAMADOL HCL 50 MG PO TABS
ORAL_TABLET | ORAL | Status: AC
  Filled 2015-07-27: qty 1

## 2015-07-27 MED ORDER — ONDANSETRON HCL 4 MG/2ML IJ SOLN
INTRAMUSCULAR | Status: DC | PRN
  Administered 2015-07-27: 4 mg via INTRAVENOUS

## 2015-07-27 MED ORDER — LACTATED RINGERS IV SOLN
INTRAVENOUS | Status: DC
  Filled 2015-07-27: qty 1000

## 2015-07-27 MED ORDER — BELLADONNA ALKALOIDS-OPIUM 16.2-60 MG RE SUPP
RECTAL | Status: AC
  Filled 2015-07-27: qty 1

## 2015-07-27 MED ORDER — PHENAZOPYRIDINE HCL 100 MG PO TABS
ORAL_TABLET | ORAL | Status: AC
  Filled 2015-07-27: qty 2

## 2015-07-27 MED ORDER — PHENAZOPYRIDINE HCL 200 MG PO TABS
200.0000 mg | ORAL_TABLET | Freq: Once | ORAL | Status: AC
  Administered 2015-07-27: 200 mg via ORAL
  Filled 2015-07-27: qty 1

## 2015-07-27 MED ORDER — TRAMADOL HCL 50 MG PO TABS
50.0000 mg | ORAL_TABLET | Freq: Once | ORAL | Status: AC
  Administered 2015-07-27: 50 mg via ORAL
  Filled 2015-07-27: qty 1

## 2015-07-27 SURGICAL SUPPLY — 39 items
ADAPTER CATH URET PLST 4-6FR (CATHETERS) IMPLANT
BAG DRAIN URO-CYSTO SKYTR STRL (DRAIN) IMPLANT
BASKET LASER NITINOL 1.9FR (BASKET) IMPLANT
BASKET STNLS GEMINI 4WIRE 3FR (BASKET) IMPLANT
BASKET ZERO TIP NITINOL 2.4FR (BASKET) ×4 IMPLANT
BOOTIES KNEE HIGH SLOAN (MISCELLANEOUS) IMPLANT
CANISTER SUCT LVC 12 LTR MEDI- (MISCELLANEOUS) IMPLANT
CATH CLEAR GEL 3F BACKSTOP (CATHETERS) IMPLANT
CATH INTERMIT  6FR 70CM (CATHETERS) IMPLANT
CATH URET 5FR 28IN CONE TIP (BALLOONS)
CATH URET 5FR 28IN OPEN ENDED (CATHETERS) IMPLANT
CATH URET 5FR 70CM CONE TIP (BALLOONS) IMPLANT
CATH URET DUAL LUMEN 6-10FR 50 (CATHETERS) IMPLANT
CLOTH BEACON ORANGE TIMEOUT ST (SAFETY) ×4 IMPLANT
FIBER LASER FLEXIVA 1000 (UROLOGICAL SUPPLIES) IMPLANT
FIBER LASER FLEXIVA 365 (UROLOGICAL SUPPLIES) IMPLANT
FIBER LASER FLEXIVA 550 (UROLOGICAL SUPPLIES) IMPLANT
FIBER LASER TRAC TIP (UROLOGICAL SUPPLIES) ×4 IMPLANT
GLOVE BIO SURGEON STRL SZ7.5 (GLOVE) ×4 IMPLANT
GOWN STRL REUS W/ TWL XL LVL3 (GOWN DISPOSABLE) ×2 IMPLANT
GOWN STRL REUS W/TWL XL LVL3 (GOWN DISPOSABLE) ×2
GUIDEWIRE 0.038 PTFE COATED (WIRE) IMPLANT
GUIDEWIRE ANG ZIPWIRE 038X150 (WIRE) IMPLANT
GUIDEWIRE STR DUAL SENSOR (WIRE) ×8 IMPLANT
IV NS 1000ML (IV SOLUTION) ×2
IV NS 1000ML BAXH (IV SOLUTION) ×2 IMPLANT
IV NS IRRIG 3000ML ARTHROMATIC (IV SOLUTION) ×4 IMPLANT
KIT BALLIN UROMAX 15FX10 (LABEL) IMPLANT
KIT BALLN UROMAX 15FX4 (MISCELLANEOUS) IMPLANT
KIT BALLN UROMAX 26 75X4 (MISCELLANEOUS)
KIT ROOM TURNOVER WOR (KITS) ×8 IMPLANT
MANIFOLD NEPTUNE II (INSTRUMENTS) IMPLANT
NS IRRIG 1000ML POUR BTL (IV SOLUTION) IMPLANT
NS IRRIG 500ML POUR BTL (IV SOLUTION) ×4 IMPLANT
PACK CYSTO (CUSTOM PROCEDURE TRAY) ×4 IMPLANT
SET HIGH PRES BAL DIL (LABEL)
SHEATH ACCESS URETERAL 38CM (SHEATH) ×4 IMPLANT
STENT URET 6FRX24 CONTOUR (STENTS) ×4 IMPLANT
SYRINGE IRR TOOMEY STRL 70CC (SYRINGE) ×4 IMPLANT

## 2015-07-27 NOTE — Anesthesia Procedure Notes (Signed)
Procedure Name: LMA Insertion Date/Time: 07/27/2015 11:46 AM Performed by: Maris BergerENENNY, Shavonta Gossen T Pre-anesthesia Checklist: Patient identified, Emergency Drugs available, Suction available and Patient being monitored Patient Re-evaluated:Patient Re-evaluated prior to inductionOxygen Delivery Method: Circle System Utilized Preoxygenation: Pre-oxygenation with 100% oxygen Intubation Type: IV induction Ventilation: Mask ventilation without difficulty LMA: LMA inserted LMA Size: 5.0 Number of attempts: 1 Airway Equipment and Method: Bite block Placement Confirmation: positive ETCO2 Dental Injury: Teeth and Oropharynx as per pre-operative assessment

## 2015-07-27 NOTE — Discharge Instructions (Addendum)
Kidney Stones °Kidney stones (urolithiasis) are deposits that form inside your kidneys. The intense pain is caused by the stone moving through the urinary tract. When the stone moves, the ureter goes into spasm around the stone. The stone is usually passed in the urine.  °CAUSES  °1. A disorder that makes certain neck glands produce too much parathyroid hormone (primary hyperparathyroidism). °2. A buildup of uric acid crystals, similar to gout in your joints. °3. Narrowing (stricture) of the ureter. °4. A kidney obstruction present at birth (congenital obstruction). °5. Previous surgery on the kidney or ureters. °6. Numerous kidney infections. °SYMPTOMS  °· Feeling sick to your stomach (nauseous). °· Throwing up (vomiting). °· Blood in the urine (hematuria). °· Pain that usually spreads (radiates) to the groin. °· Frequency or urgency of urination. °DIAGNOSIS  °· Taking a history and physical exam. °· Blood or urine tests. °· CT scan. °· Occasionally, an examination of the inside of the urinary bladder (cystoscopy) is performed. °TREATMENT  °· Observation. °· Increasing your fluid intake. °· Extracorporeal shock wave lithotripsy--This is a noninvasive procedure that uses shock waves to break up kidney stones. °· Surgery may be needed if you have severe pain or persistent obstruction. There are various surgical procedures. Most of the procedures are performed with the use of small instruments. Only small incisions are needed to accommodate these instruments, so recovery time is minimized. °The size, location, and chemical composition are all important variables that will determine the proper choice of action for you. Talk to your health care provider to better understand your situation so that you will minimize the risk of injury to yourself and your kidney.  °HOME CARE INSTRUCTIONS  °· Drink enough water and fluids to keep your urine clear or pale yellow. This will help you to pass the stone or stone  fragments. °· Strain all urine through the provided strainer. Keep all particulate matter and stones for your health care provider to see. The stone causing the pain may be as small as a grain of salt. It is very important to use the strainer each and every time you pass your urine. The collection of your stone will allow your health care provider to analyze it and verify that a stone has actually passed. The stone analysis will often identify what you can do to reduce the incidence of recurrences. °· Only take over-the-counter or prescription medicines for pain, discomfort, or fever as directed by your health care provider. °· Keep all follow-up visits as told by your health care provider. This is important. °· Get follow-up X-rays if required. The absence of pain does not always mean that the stone has passed. It may have only stopped moving. If the urine remains completely obstructed, it can cause loss of kidney function or even complete destruction of the kidney. It is your responsibility to make sure X-rays and follow-ups are completed. Ultrasounds of the kidney can show blockages and the status of the kidney. Ultrasounds are not associated with any radiation and can be performed easily in a matter of minutes. °· Make changes to your daily diet as told by your health care provider. You may be told to: °¨ Limit the amount of salt that you eat. °¨ Eat 5 or more servings of fruits and vegetables each day. °¨ Limit the amount of meat, poultry, fish, and eggs that you eat. °· Collect a 24-hour urine sample as told by your health care provider. You may need to collect another urine sample every 6-12   months. SEEK MEDICAL CARE IF:  You experience pain that is progressive and unresponsive to any pain medicine you have been prescribed. SEEK IMMEDIATE MEDICAL CARE IF:   Pain cannot be controlled with the prescribed medicine.  You have a fever or shaking chills.  The severity or intensity of pain increases over  18 hours and is not relieved by pain medicine.  You develop a new onset of abdominal pain.  You feel faint or pass out.  You are unable to urinate.   This information is not intended to replace advice given to you by your health care provider. Make sure you discuss any questions you have with your health care provider.   Document Released: 09/26/2005 Document Revised: 06/17/2015 Document Reviewed: 02/27/2013 Elsevier Interactive Patient Education 2016 [employer]      Ok to remove JJ stent on Friday morning    [facility] [phone] Post Ureteroscopy With or Without Stent Instructions  Definitions:  Ureter: The duct that transports urine from the kidney to the bladder. Stent:   A plastic hollow tube that is placed into the ureter, from the kidney to the bladder to prevent the ureter from swelling shut.  GENERAL INSTRUCTIONS:  Despite the fact that no skin incisions were used, the area around the ureter and bladder is raw and irritated. The stent is a foreign body which will further irritate the bladder wall. This irritation is manifested by increased frequency of urination, both day and night, and by an increase in the urge to urinate. In some, the urge to urinate is present almost always. Sometimes the urge is strong enough that you may not be able to stop yourself from urinating. The only real cure is to remove the stent and then give time for the bladder wall to heal which can't be done until the danger of the ureter swelling shut has passed, which varies.  You may see some blood in your urine while the stent is in place and a few days afterwards. Do not be alarmed, even if the urine was clear for a while. Get off your feet and drink lots of fluids until clearing occurs. If you start to pass clots or don't improve, call us.  DIET: You may return to your normal diet immediately. Because of the raw surface of your bladder, alcohol, spicy foods, acid type  foods and drinks with caffeine may cause irritation or frequency and should be used in moderation. To keep your urine flowing freely and to avoid constipation, drink plenty of fluids during the day ( 8-10 glasses ). Tip: Avoid cranberry juice because it is very acidic.  ACTIVITY: Your physical activity doesn't need to be restricted. However, if you are very active, you may see some blood in your urine. We suggest that you reduce your activity under these circumstances until the bleeding has stopped.  BOWELS: It is important to keep your bowels regular during the postoperative period. Straining with bowel movements can cause bleeding. A bowel movement every other day is reasonable. Use a mild laxative if needed, such as Milk of Magnesia 2-3 tablespoons, or 2 Dulcolax tablets. Call if you continue to have problems. If you have been taking narcotics for pain, before, during or after your surgery, you may be constipated. Take a laxative if necessary.   MEDICATION: You should resume your pre-surgery medications unless told not to. In addition you will often be given an antibiotic to prevent infection. These should be taken as prescribed until the bottles are  finished unless you are having an unusual reaction to one of the drugs.  PROBLEMS YOU SHOULD REPORT TO [place]:  Fevers over 100.5 Fahrenheit.  Heavy bleeding, or clots ( See above notes about blood in urine ).  Inability to urinate.  Drug reactions ( hives, rash, nausea, vomiting, diarrhea ).  Severe burning or pain with urination that is not improving.  FOLLOW-UP: You will need a follow-up appointment to monitor your progress. Call for this appointment at the number listed above. Usually the first appointment will be about three to fourteen days after your surgery.      Post Anesthesia Home Care Instructions  Activity: Get plenty of rest for the remainder of the day. A responsible adult should stay with you for 24 hours following the  procedure.  For the next 24 hours, DO NOT: -Drive a car -Advertising copywriter -Drink alcoholic beverages -Take any medication unless instructed by your physician -Make any legal decisions or sign important papers.  Meals: Start with liquid foods such as gelatin or soup. Progress to regular foods as tolerated. Avoid greasy, spicy, heavy foods. If nausea and/or vomiting occur, drink only clear liquids until the nausea and/or vomiting subsides. Call your physician if vomiting continues.  Special Instructions/Symptoms: Your throat may feel dry or sore from the anesthesia or the breathing tube placed in your throat during surgery. If this causes discomfort, gargle with warm salt water. The discomfort should disappear within 24 hours.  If you had a scopolamine patch placed behind your ear for the management of post- operative nausea and/or vomiting:  1. The medication in the patch is effective for 72 hours, after which it should be removed.  Wrap patch in a tissue and discard in the trash. Wash hands thoroughly with soap and water. 2. You may remove the patch earlier than 72 hours if you experience unpleasant side effects which may include dry mouth, dizziness or visual disturbances. 3. Avoid touching the patch. Wash your hands with soap and water after contact with the patch.

## 2015-07-27 NOTE — Interval H&P Note (Signed)
History and Physical Interval Note:  07/27/2015 11:36 AM  Carl Diaz  has presented today for surgery, with the diagnosis of RIGHT UPPER MID URETERAL STONE  The various methods of treatment have been discussed with the patient and family. After consideration of risks, benefits and other options for treatment, the patient has consented to  Procedure(s): RIGHT URETEROSCOPY, STONE BASKETRY  (Right) LASER OF STONE (Right) as a surgical intervention .  The patient's history has been reviewed, patient examined, no change in status, stable for surgery.  I have reviewed the patient's chart and labs.  Questions were answered to the patient's satisfaction.     Nezzie Manera I Marilee Ditommaso

## 2015-07-27 NOTE — Transfer of Care (Signed)
Immediate Anesthesia Transfer of Care Note  Patient: Carl Diaz  Procedure(s) Performed: Procedure(s): RIGHT URETEROSCOPY, STONE BASKETRY  (Right) LASER OF STONE (Right) CYSTOSCOPY WITH STENT REMOVAL (Right) CYSTOSCOPY WITH RETROGRADE PYELOGRAM (Right) CYSTOSCOPY WITH STENT PLACEMENT (Right)  Patient Location: PACU  Anesthesia Type:General  Level of Consciousness: sedated  Airway & Oxygen Therapy: Patient Spontanous Breathing and Patient connected to nasal cannula oxygen  Post-op Assessment: Report given to RN  Post vital signs: Reviewed and stable  Last Vitals:  Filed Vitals:   07/27/15 0923  BP: 126/81  Pulse: 60  Temp: 36.6 C  Resp: 18    Complications: No apparent anesthesia complications

## 2015-07-27 NOTE — Anesthesia Postprocedure Evaluation (Signed)
  Anesthesia Post-op Note  Patient: Carl Diaz  Procedure(s) Performed: Procedure(s): RIGHT URETEROSCOPY, STONE BASKETRY  (Right) LASER OF STONE (Right) CYSTOSCOPY WITH STENT REMOVAL (Right) CYSTOSCOPY WITH RETROGRADE PYELOGRAM (Right) CYSTOSCOPY WITH STENT PLACEMENT (Right)  Patient Location: PACU  Anesthesia Type:General  Level of Consciousness: awake, alert  and oriented  Airway and Oxygen Therapy: Patient Spontanous Breathing  Post-op Pain: mild  Post-op Assessment: Post-op Vital signs reviewed, Patient's Cardiovascular Status Stable, Respiratory Function Stable and Patent Airway              Post-op Vital Signs: Reviewed and stable  Last Vitals:  Filed Vitals:   07/27/15 1511  BP: 124/76  Pulse: 79  Temp: 36.6 C  Resp: 20    Complications: No apparent anesthesia complications

## 2015-07-27 NOTE — Anesthesia Preprocedure Evaluation (Signed)
Anesthesia Evaluation  Patient identified by MRN, date of birth, ID band Patient awake    Reviewed: Allergy & Precautions, NPO status , Patient's Chart, lab work & pertinent test results  Airway Mallampati: I  TM Distance: >3 FB Neck ROM: Full    Dental  (+) Teeth Intact, Dental Advisory Given   Pulmonary neg pulmonary ROS,    breath sounds clear to auscultation       Cardiovascular negative cardio ROS   Rhythm:Regular Rate:Normal     Neuro/Psych negative neurological ROS     GI/Hepatic negative GI ROS, Neg liver ROS,   Endo/Other  negative endocrine ROS  Renal/GU Renal disease     Musculoskeletal   Abdominal   Peds  Hematology negative hematology ROS (+)   Anesthesia Other Findings   Reproductive/Obstetrics                             Anesthesia Physical  Anesthesia Plan  ASA: I  Anesthesia Plan: General   Post-op Pain Management:    Induction: Intravenous  Airway Management Planned: LMA  Additional Equipment:   Intra-op Plan:   Post-operative Plan:   Informed Consent: I have reviewed the patients History and Physical, chart, labs and discussed the procedure including the risks, benefits and alternatives for the proposed anesthesia with the patient or authorized representative who has indicated his/her understanding and acceptance.   Dental advisory given  Plan Discussed with: CRNA  Anesthesia Plan Comments:         Anesthesia Quick Evaluation

## 2015-07-27 NOTE — Op Note (Addendum)
Pre-operative diagnosis :  Retained Right upper ureteral stones x 2  Postoperative diagnosis:  Same  Operation: Cystourethroscopy, remnoval of right JJ stent, right retrograde pyelogram with interpretation, Right ureteroscopy, basket extraction of stones x 2 from the right mid ureter, placement of right JJ stent, 19F x 24 cm. ( suture attached) Surgeon:  S. Patsi Searsannenbaum, MD  First assistant: None  Anesthesia: General LMA  Preparation:  After appropriate preanesthesia, the patient was brought the operating room, placed on the operating table in the dorsal supine position where general LMA anesthesia was induced.  Review history:  49 yo male returns today to review 24hr urine results, a KUB and possible cystoscopy/Rt JJ stent removal. He is s/p Rt ESWL on 07/13/15 by Dr. Annabell HowellsWrenn for a 6mm Rt UPJ stone. He is s/p cysto/Rt RPG/Rt ureteroscopy/laser of Rt distal ureteral stone/basket extraction/Rt JJ stent on 06/09/15.     Originally referred by [facility] for f/u of kidney stone. He was seen at [facility] on 06/08/15 for RLQ abdominal pain associated with some nausea. CT showed a 9 mm proximal mid Rt ureteral stone with severe hydronephrosis and another 3-4 mm distal Rt ureteral stone. He also has a 3-4 mm Lt mid pole renal stone and a 1 mm Lt lower pole stone. He has urgency, frequency, dysuria, nocturia, hesitancy & intermittent flow, stranguria. Meds from ER: oxycodone 5/325, flomax, zofran.   Statement of  Likelihood of Success: Excellent. TIME-OUT observed.:  Procedure:  Cystourethroscopy was Compass, and right double-J catheter was identified. The patient appeared to have stone crystallization on the distal portion of the right double-J stent. The distal portion was grasped with the alligator forceps, and removed. A 6 French opening catheter was then used to perform right retrograde PolyGram, which was accomplished. Even using magnification 3, I was unable to see the stones within the ureter, as  identified on CT scan. A 0.038 guidewire was passed through the cystoscope, into the renal pelvis and coiled.  The dual channel 6.5 French ureteroscope was then passed along the side of the guidewire, through the right ureteral orifice, and into the lower ureter. It was difficult to pass the ureteroscope over the pelvic vessels. In order to do this, I first passed a 0 tip basket, through the ureteroscope, and over the vessels, and followed the basket, over the vessels, into the mid ureter. The entire ureter was scoped up to the level of the kidneys, and the basket was opened. Upon slow removal of the ureteroscope, the stones were identified in the mid ureter. The stones were removed individually. The ureter was edematous, friable, with clot remaining. I elected to replace double-J stent, and a 6 French by 24 cm double-J stent was passed into the ureter, and coiled in the renal pelvis. A suture was left attached to the lower portion of the double-J stent. This was brought out through the urethra and taped to the penis. Xylocaine jelly was placed in the urethra, after the bladder was drained of fluid. The patient received IV Tylenol and IV Toradol. He was awakened and taken to recovery room in good condition. Stones were taken for examination.  Addendum: Stones soze: 4mm and 3mm . Completely removed. ST

## 2015-07-28 ENCOUNTER — Encounter (HOSPITAL_BASED_OUTPATIENT_CLINIC_OR_DEPARTMENT_OTHER): Payer: Self-pay | Admitting: Urology

## 2015-07-28 LAB — POCT HEMOGLOBIN-HEMACUE: HEMOGLOBIN: 11.5 g/dL — AB (ref 13.0–17.0)

## 2016-04-24 IMAGING — CR DG ABDOMEN 1V
1 series · 1 of 1 positions shown · non-contrast
Comparison: CT of the abdomen and pelvis dated 06/29/2015.

CLINICAL DATA: Pre lithotripsy right-sided stone. Stent placed 3
weeks ago.

EXAM:
ABDOMEN - 1 VIEW

[t abdomen supine]
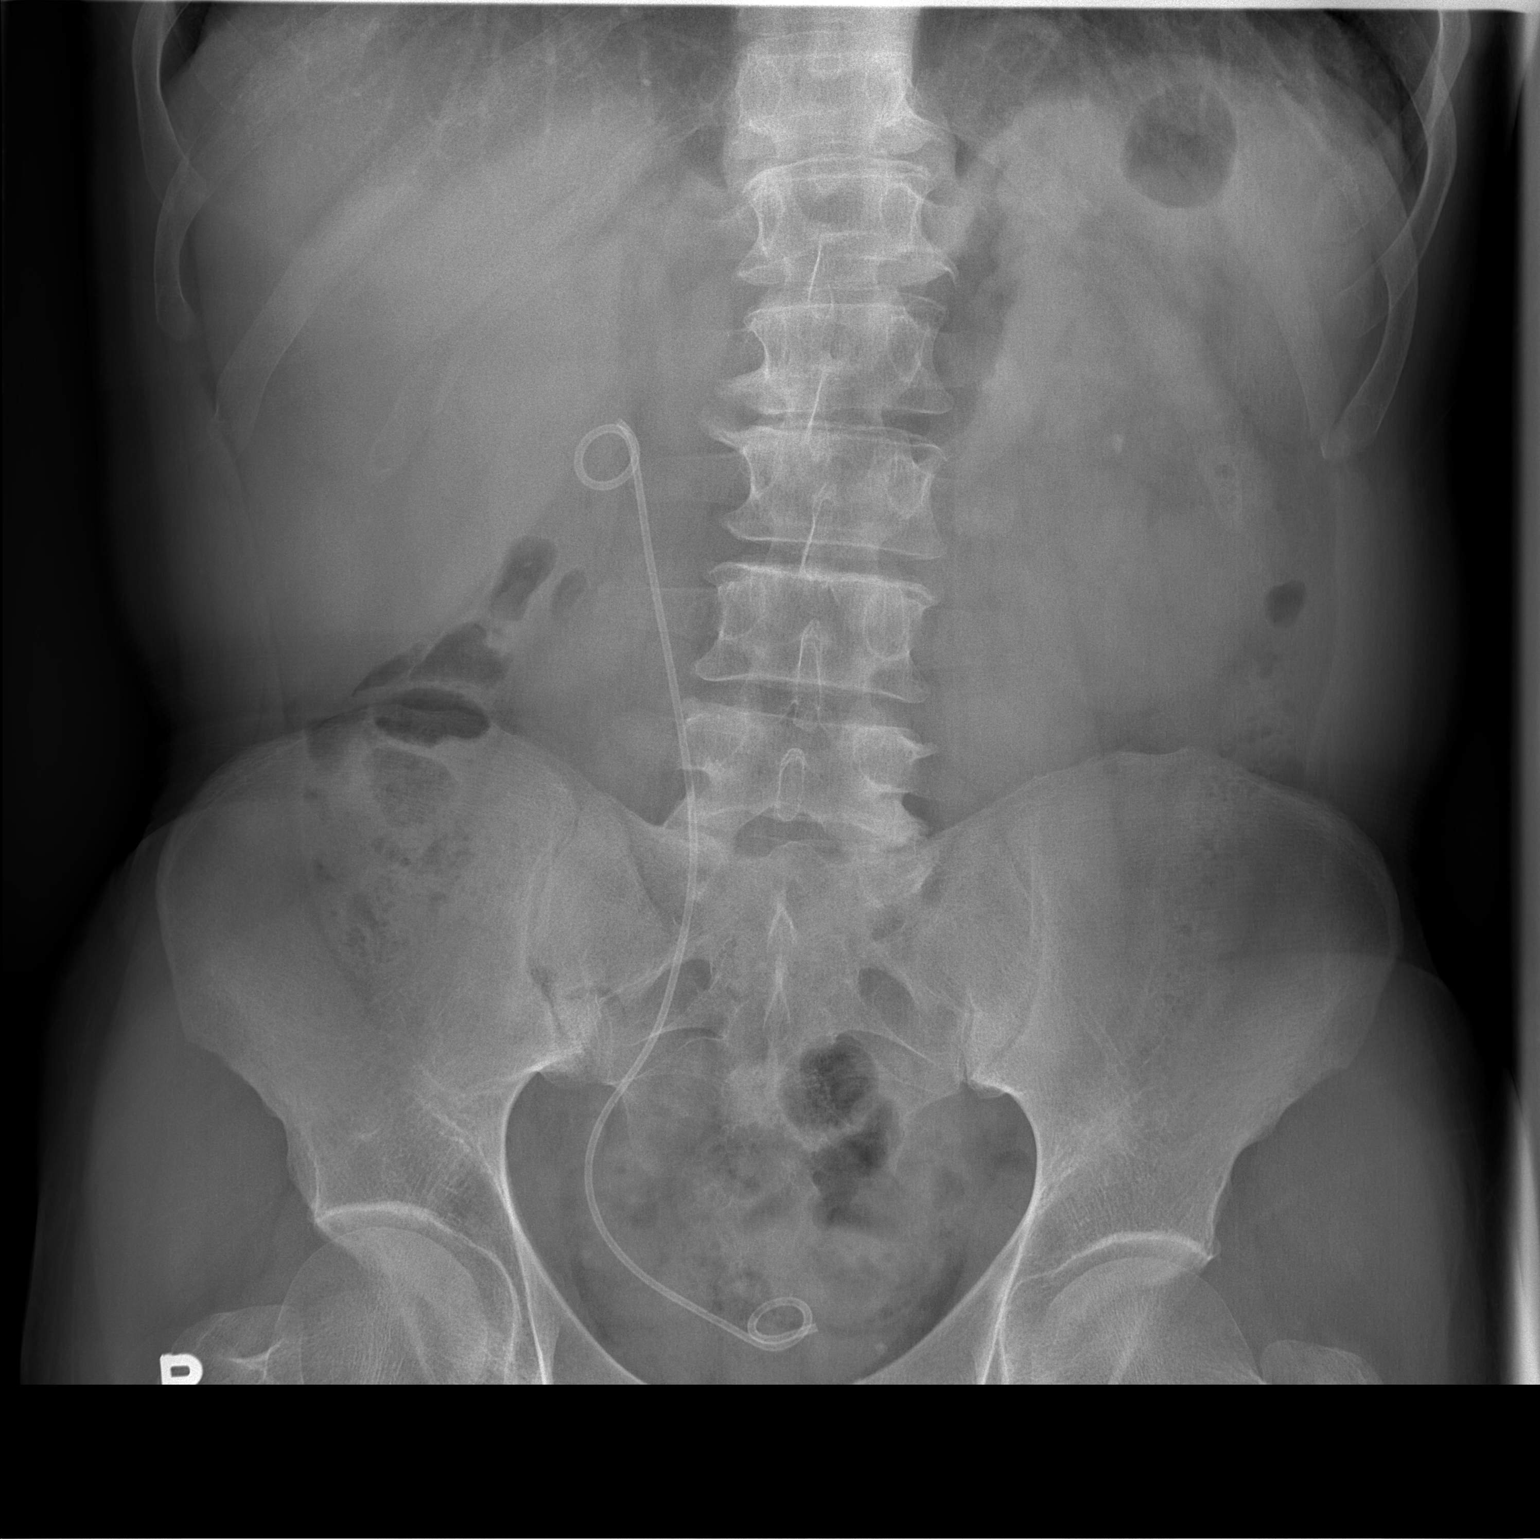

[1 of 1 positions shown; findings below may reference images not displayed]

FINDINGS: Right-sided nephroureteral stent remains in place, grossly
well-positioned. The 6 mm stone identified within the right ureter,
adjacent to the stent, on previous CT is no longer identified. No
right-sided renal stones seen.

Left renal stone measuring 5 mm again noted. Small calcification in
the left lower pelvis is compatible with the benign vascular
phlebolith identified on recent CT.

Bowel gas pattern is nonobstructive. No free intraperitoneal air
seen. Mild degenerative change again noted within the slightly
scoliotic lumbar spine.
IMPRESSION: Right-sided nephroureteral stent remains grossly well positioned.

The right-sided ureteral stone identified on CT of 06/29/2015 is not
seen on this plain film today.

5 mm left renal stone.

## 2018-08-23 ENCOUNTER — Encounter: Payer: Self-pay | Admitting: Emergency Medicine

## 2018-08-24 ENCOUNTER — Encounter: Payer: Self-pay | Admitting: *Deleted

## 2018-08-24 ENCOUNTER — Ambulatory Visit: Payer: 59 | Admitting: Anesthesiology

## 2018-08-24 ENCOUNTER — Ambulatory Visit
Admission: RE | Admit: 2018-08-24 | Discharge: 2018-08-24 | Disposition: A | Discharge status: Home / Self Care | Payer: 59 | Source: Ambulatory Visit | Attending: Gastroenterology | Admitting: Gastroenterology

## 2018-08-24 ENCOUNTER — Encounter
Admission: RE | Disposition: A | Discharge status: Home / Self Care | Payer: Self-pay | Source: Ambulatory Visit | Attending: Gastroenterology

## 2018-08-24 DIAGNOSIS — Z1211 Encounter for screening for malignant neoplasm of colon: Secondary | ICD-10-CM | POA: Diagnosis present

## 2018-08-24 DIAGNOSIS — Z91041 Radiographic dye allergy status: Secondary | ICD-10-CM | POA: Insufficient documentation

## 2018-08-24 HISTORY — PX: COLONOSCOPY WITH PROPOFOL: SHX5780

## 2018-08-24 SURGERY — COLONOSCOPY WITH PROPOFOL
Anesthesia: General

## 2018-08-24 MED ORDER — LIDOCAINE HCL (CARDIAC) PF 100 MG/5ML IV SOSY
PREFILLED_SYRINGE | INTRAVENOUS | Status: DC | PRN
  Administered 2018-08-24: 50 mg via INTRATRACHEAL

## 2018-08-24 MED ORDER — PROPOFOL 500 MG/50ML IV EMUL
INTRAVENOUS | Status: AC
  Filled 2018-08-24: qty 50

## 2018-08-24 MED ORDER — SODIUM CHLORIDE 0.9 % IV SOLN
INTRAVENOUS | Status: DC
  Administered 2018-08-24: 1000 mL via INTRAVENOUS

## 2018-08-24 MED ORDER — PROPOFOL 10 MG/ML IV BOLUS
INTRAVENOUS | Status: DC | PRN
  Administered 2018-08-24: 80 mg via INTRAVENOUS

## 2018-08-24 MED ORDER — PROPOFOL 500 MG/50ML IV EMUL
INTRAVENOUS | Status: DC | PRN
  Administered 2018-08-24: 100 ug/kg/min via INTRAVENOUS

## 2018-08-24 NOTE — H&P (Signed)
Outpatient short stay form Pre-procedure 08/24/2018 12:38 PM Carl DeemMartin U Lajarvis Italiano MD  Primary Physician: Dr  Marisue IvanKanhka Linthavong  Reason for visit: Colonoscopy  History of present illness: Patient is a 52 year old male presenting today for colon cancer screening.  No previous colonoscopy.  He takes no aspirin or blood thinning agent.  He tolerated his prep well.  No problems with abdominal pain rectal bleeding or diarrhea.   No current facility-administered medications for this encounter.   Medications Prior to Admission  Medication Sig Dispense Refill Last Dose  . indapamide (LOZOL) 2.5 MG tablet Take 2.5 mg by mouth daily.     . Potassium Citrate 15 MEQ (1620 MG) TBCR Take by mouth.     . Ketorolac Tromethamine (SPRIX) 15.75 MG/SPRAY SOLN Place 1 % into the nose every 8 (eight) hours as needed.   Unknown at never  . phenazopyridine (PYRIDIUM) 200 MG tablet Take 1 tablet (200 mg total) by mouth 3 (three) times daily as needed for pain. 30 tablet 2 07/27/2015 at 0700  . phenazopyridine (PYRIDIUM) 200 MG tablet Take 1 tablet (200 mg total) by mouth 3 (three) times daily as needed for pain. 60 tablet 3   . traMADol-acetaminophen (ULTRACET) 37.5-325 MG per tablet Take 1 tablet by mouth every 6 (six) hours as needed. 30 tablet 0 07/27/2015 at 0700  . traMADol-acetaminophen (ULTRACET) 37.5-325 MG tablet Take 1 tablet by mouth every 6 (six) hours as needed. 30 tablet 0   . trimethoprim (TRIMPEX) 100 MG tablet Take 1 tablet (100 mg total) by mouth 1 day or 1 dose. 30 tablet 1      Allergies  Allergen Reactions  . Metrizamide Hives  . Ivp Dye [Iodinated Diagnostic Agents] Hives and Other (See Comments)    Lightheaded      Past Medical History:  Diagnosis Date  . History of kidney stones   . Nephrolithiasis    bilateral -- nonobstructive per CT  . Right ureteral stone   . Urgency of urination     Review of systems:      Physical Exam    Heart and lungs: Regular rate and rhythm  without rub or gallop, lungs are bilaterally clear.    HEENT: Normocephalic atraumatic eyes are anicteric    Other:    Pertinant exam for procedure: Soft nontender nondistended bowel sounds positive normoactive.    Planned proceedures: Colonoscopy and indicated procedures. I have discussed the risks benefits and complications of procedures to include not limited to bleeding, infection, perforation and the risk of sedation and the patient wishes to proceed.    Carl DeemMartin U Carl Pluta, MD Gastroenterology 08/24/2018  12:38 PM

## 2018-08-24 NOTE — Anesthesia Preprocedure Evaluation (Addendum)
Anesthesia Evaluation  Patient identified by MRN, date of birth, ID band Patient awake    Reviewed: Allergy & Precautions, H&P , NPO status , Patient's Chart, lab work & pertinent test results  Airway Mallampati: II  TM Distance: >3 FB     Dental  (+) Teeth Intact   Pulmonary neg pulmonary ROS,           Cardiovascular negative cardio ROS       Neuro/Psych negative neurological ROS  negative psych ROS   GI/Hepatic negative GI ROS, Neg liver ROS,   Endo/Other  negative endocrine ROS  Renal/GU negative Renal ROS  negative genitourinary   Musculoskeletal   Abdominal   Peds  Hematology negative hematology ROS (+)   Anesthesia Other Findings Past Medical History: No date: History of kidney stones No date: Nephrolithiasis     Comment:  bilateral -- nonobstructive per CT No date: Right ureteral stone No date: Urgency of urination  Past Surgical History: 07/27/2015: CYSTOSCOPY W/ RETROGRADES; Right     Comment:  Procedure: CYSTOSCOPY WITH RETROGRADE PYELOGRAM;                Surgeon: Jethro Bolus, MD;  Location: [facility];  Service: Urology;  Laterality: Right; 07/27/2015: CYSTOSCOPY W/ URETERAL STENT REMOVAL; Right     Comment:  Procedure: CYSTOSCOPY WITH STENT REMOVAL;  Surgeon:               Jethro Bolus, MD;  Location: [facility];  Service: Urology;  Laterality: Right; 06/09/2015: CYSTOSCOPY WITH RETROGRADE PYELOGRAM, URETEROSCOPY AND  STENT PLACEMENT; Right     Comment:  Procedure: CYSTOSCOPY WITH RETROGRADE PYELOGRAM,               URETEROSCOPY AND STENT PLACEMENT, HOLMIUM LASER               APPLICATION ;  Surgeon: Jethro Bolus, MD;                Location: [facility];  Service: Urology;  Laterality: Right; 07/27/2015: CYSTOSCOPY WITH STENT PLACEMENT; Right     Comment:  Procedure: CYSTOSCOPY WITH STENT PLACEMENT;  Surgeon:        Jethro Bolus, MD;  Location: [facility];  Service: Urology;  Laterality: Right; 07/27/2015: CYSTOSCOPY WITH URETEROSCOPY, STONE BASKETRY AND STENT  PLACEMENT; Right     Comment:  Procedure: RIGHT URETEROSCOPY, STONE BASKETRY ;                Surgeon: Jethro Bolus, MD;  Location: [facility];  Service: Urology;  Laterality: Right; 07-13-2015: EXTRACORPOREAL SHOCK WAVE LITHOTRIPSY; Right 07/27/2015: HOLMIUM LASER APPLICATION; Right     Comment:  Procedure: LASER OF STONE;  Surgeon: Jethro Bolus,              MD;  Location: [facility];  Service:               Urology;  Laterality: Right;     Reproductive/Obstetrics negative OB ROS  Anesthesia Physical Anesthesia Plan  ASA: I  Anesthesia Plan: General   Post-op Pain Management:    Induction:   PONV Risk Score and Plan: Propofol infusion and TIVA  Airway Management Planned: Natural Airway and Nasal Cannula  Additional Equipment:   Intra-op Plan:   Post-operative Plan:   Informed Consent: I have reviewed the patients History and Physical, chart, labs and discussed the procedure including the risks, benefits and alternatives for the proposed anesthesia with the patient or authorized representative who has indicated his/her understanding and acceptance.   Dental Advisory Given  Plan Discussed with: Anesthesiologist, CRNA and Surgeon  Anesthesia Plan Comments:         Anesthesia Quick Evaluation

## 2018-08-24 NOTE — Op Note (Signed)
[facility] Gastroenterology Patient Name: Carl Diaz Procedure Date: 08/24/2018 1:11 PM MRN: [id number] Account #: [id number] Date of Birth: 11-25-1965 Admit Type: Outpatient Age: [age] [facility] Gender: Male Note Status: Finalized Procedure:            Colonoscopy Indications:          Screening for colorectal malignant neoplasm, This is                        the patient's first colonoscopy Providers:            Christena DeemMartin U. Skulskie, MD Referring MD:         Marisue IvanKanhka Linthavong (Referring MD) Medicines:            Monitored Anesthesia Care Complications:        No immediate complications. Procedure:            Pre-Anesthesia Assessment:                       - ASA Grade Assessment: I - A normal, healthy patient.                       After obtaining informed consent, the colonoscope was                        passed under direct vision. Throughout the procedure,                        the patient's blood pressure, pulse, and oxygen                        saturations were monitored continuously. The was                        introduced through the anus and advanced to the the                        cecum, identified by appendiceal orifice and ileocecal                        valve. The colonoscopy was performed without                        difficulty. The patient tolerated the procedure well.                        The quality of the bowel preparation was good. Findings:      The colon (entire examined portion) appeared normal.      The retroflexed view of the distal rectum and anal verge was normal and       showed no anal or rectal abnormalities.      The digital rectal exam was normal. Impression:           - The entire examined colon is normal.                       - The distal rectum and anal verge are normal on                        retroflexion view.                       -  No specimens collected. Recommendation:       - Discharge  patient to home.                       - Repeat colonoscopy in 10 years for screening purposes. Procedure Code(s):    --- Professional ---                       [phone], Colonoscopy, flexible; diagnostic, including                        collection of specimen(s) by brushing or washing, when                        performed (separate procedure) Diagnosis Code(s):    --- Professional ---                       Z12.11, Encounter for screening for malignant neoplasm                        of colon CPT copyright 2018 [employer]. All rights reserved. The codes documented in this report are preliminary and upon coder review may  be revised to meet current compliance requirements. Christena Deem, MD 08/24/2018 1:38:39 PM This report has been signed electronically. Number of Addenda: 0 Note Initiated On: 08/24/2018 1:11 PM Scope Withdrawal Time: 0 hours 9 minutes 22 seconds  Total Procedure Duration: 0 hours 18 minutes 28 seconds       [facility]

## 2018-08-24 NOTE — Anesthesia Post-op Follow-up Note (Signed)
Anesthesia QCDR form completed.        

## 2018-08-24 NOTE — Transfer of Care (Signed)
Immediate Anesthesia Transfer of Care Note  Patient: Carl Diaz  Procedure(s) Performed: COLONOSCOPY WITH PROPOFOL (N/A )  Patient Location: PACU and Endoscopy Unit  Anesthesia Type:General  Level of Consciousness: sedated and unresponsive  Airway & Oxygen Therapy: Patient Spontanous Breathing and Patient connected to nasal cannula oxygen  Post-op Assessment: Report given to RN and Post -op Vital signs reviewed and stable  Post vital signs: Reviewed and stable  Last Vitals:  Vitals Value Taken Time  BP 104/72 08/24/2018  1:43 PM  Temp 36.1 C 08/24/2018  1:43 PM  Pulse 60 08/24/2018  1:44 PM  Resp 15 08/24/2018  1:44 PM  SpO2 100 % 08/24/2018  1:44 PM  Vitals shown include unvalidated device data.  Last Pain:  Vitals:   08/24/18 1343  TempSrc: Tympanic  PainSc: Asleep      Patients Stated Pain Goal: 0 (08/24/18 1249)  Complications: No apparent anesthesia complications

## 2018-08-27 NOTE — Anesthesia Postprocedure Evaluation (Signed)
Anesthesia Post Note  Patient: Carl Diaz  Procedure(s) Performed: COLONOSCOPY WITH PROPOFOL (N/A )  Patient location during evaluation: PACU Anesthesia Type: General Level of consciousness: awake and alert Pain management: pain level controlled Vital Signs Assessment: post-procedure vital signs reviewed and stable Respiratory status: spontaneous breathing, nonlabored ventilation and respiratory function stable Cardiovascular status: blood pressure returned to baseline and stable Postop Assessment: no apparent nausea or vomiting Anesthetic complications: no     Last Vitals:  Vitals:   08/24/18 1353 08/24/18 1403  BP: 117/76 116/85  Pulse: 70 64  Resp: 17 16  Temp:    SpO2: 98% 100%    Last Pain:  Vitals:   08/24/18 1403  TempSrc:   PainSc: 0-No pain                 Jovita GammaKathryn L Fitzgerald

## 2018-08-28 ENCOUNTER — Encounter: Payer: Self-pay | Admitting: Gastroenterology
